# Patient Record
Sex: Male | Born: 1985 | Race: White | Hispanic: No | Marital: Married | Smoking: Never smoker
Health system: Southern US, Community
[De-identification: ages and names within clinical notes are randomized; demographics above are authoritative.]

## PROBLEM LIST (undated history)

## (undated) DIAGNOSIS — K5790 Diverticulosis of intestine, part unspecified, without perforation or abscess without bleeding: Secondary | ICD-10-CM

## (undated) DIAGNOSIS — U071 COVID-19: Secondary | ICD-10-CM

## (undated) DIAGNOSIS — K5792 Diverticulitis of intestine, part unspecified, without perforation or abscess without bleeding: Secondary | ICD-10-CM

## (undated) HISTORY — PX: HERNIA REPAIR: SHX51

## (undated) HISTORY — DX: COVID-19: U07.1

---
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
## 2005-11-16 ENCOUNTER — Emergency Department (HOSPITAL_COMMUNITY): Admission: EM | Admit: 2005-11-16 | Discharge: 2005-11-16 | Payer: Self-pay | Admitting: *Deleted

## 2006-09-14 ENCOUNTER — Emergency Department (HOSPITAL_COMMUNITY): Admission: EM | Admit: 2006-09-14 | Discharge: 2006-09-14 | Payer: Self-pay | Admitting: Emergency Medicine

## 2014-02-02 ENCOUNTER — Ambulatory Visit (INDEPENDENT_AMBULATORY_CARE_PROVIDER_SITE_OTHER): Payer: Self-pay | Admitting: Physician Assistant

## 2014-02-02 VITALS — BP 118/76 | HR 75 | Temp 97.8°F | Resp 18 | Ht 66.5 in | Wt 184.0 lb

## 2014-02-02 DIAGNOSIS — L723 Sebaceous cyst: Secondary | ICD-10-CM

## 2014-02-02 DIAGNOSIS — L720 Epidermal cyst: Secondary | ICD-10-CM

## 2014-02-02 NOTE — Patient Instructions (Signed)
[facility] [address], [place], [place] [address] [phone]

## 2014-02-02 NOTE — Progress Notes (Signed)
   Subjective:    Patient ID: Paul Rodgers, male    DOB: Jul 10, 1985, 28 y.o.   MRN: [id number]  HPI Pt presents to clinic with a lesion on his penis that has been there for several months - it had not gotten larger until about 2 days ago when it got slightly larger but no erythema and no pain.  He had STD testing at another clinic about 1 month ago and has not heard from them or the health department.  He was told he had micro-wart. He has a new partner.  Review of Systems  Genitourinary: Negative for discharge and penile pain.       Objective:   Physical Exam  Vitals reviewed. Constitutional: He is oriented to person, place, and time. He appears well-developed and well-nourished.  HENT:  Head: Normocephalic and atraumatic.  Right Ear: External ear normal.  Left Ear: External ear normal.  Pulmonary/Chest: Effort normal.  Genitourinary: Circumcised.  Inclusion cyst removed with pressure - no erythema around the cyst.  No other lesions seen.  Neurological: He is alert and oriented to person, place, and time.  Skin: Skin is warm and dry.  Psychiatric: He has a normal mood and affect. His behavior is normal. Judgment and thought content normal.      Assessment & Plan:  Inclusion cyst - removed.  Suggested he go to [facility] for free STD testing.  Benny Lennert PA-C  [facility] 02/02/2014 7:51 PM

## 2014-02-17 ENCOUNTER — Ambulatory Visit (INDEPENDENT_AMBULATORY_CARE_PROVIDER_SITE_OTHER): Payer: Self-pay | Admitting: Family Medicine

## 2014-02-17 VITALS — BP 116/68 | HR 97 | Temp 98.5°F | Resp 18 | Ht 65.5 in | Wt 180.2 lb

## 2014-02-17 DIAGNOSIS — R197 Diarrhea, unspecified: Secondary | ICD-10-CM

## 2014-02-17 DIAGNOSIS — R1030 Lower abdominal pain, unspecified: Secondary | ICD-10-CM

## 2014-02-17 DIAGNOSIS — R110 Nausea: Secondary | ICD-10-CM

## 2014-02-17 DIAGNOSIS — R109 Unspecified abdominal pain: Secondary | ICD-10-CM

## 2014-02-17 LAB — POCT UA - MICROSCOPIC ONLY
Bacteria, U Microscopic: NEGATIVE
Casts, Ur, LPF, POC: NEGATIVE
Crystals, Ur, HPF, POC: NEGATIVE
Mucus, UA: NEGATIVE
Yeast, UA: NEGATIVE

## 2014-02-17 LAB — POCT URINALYSIS DIPSTICK
Bilirubin, UA: NEGATIVE
Blood, UA: NEGATIVE
Glucose, UA: NEGATIVE
Ketones, UA: NEGATIVE
Leukocytes, UA: NEGATIVE
Nitrite, UA: NEGATIVE
Spec Grav, UA: 1.015
Urobilinogen, UA: 0.2
pH, UA: 7

## 2014-02-17 LAB — POCT CBC
Granulocyte percent: 85.3 %G — AB (ref 37–80)
HCT, POC: 45.8 % (ref 43.5–53.7)
Hemoglobin: 15.5 g/dL (ref 14.1–18.1)
Lymph, poc: 1.3 (ref 0.6–3.4)
MCH, POC: 30 pg (ref 27–31.2)
MCHC: 33.9 g/dL (ref 31.8–35.4)
MCV: 88.4 fL (ref 80–97)
MID (cbc): 0.4 (ref 0–0.9)
MPV: 7.1 fL (ref 0–99.8)
POC Granulocyte: 9.9 — AB (ref 2–6.9)
POC LYMPH PERCENT: 11.4 %L (ref 10–50)
POC MID %: 3.3 %M (ref 0–12)
Platelet Count, POC: 200 10*3/uL (ref 142–424)
RBC: 5.18 M/uL (ref 4.69–6.13)
RDW, POC: 12.7 %
WBC: 11.6 10*3/uL — AB (ref 4.6–10.2)

## 2014-02-17 MED ORDER — ONDANSETRON 4 MG PO TBDP: 4.0000 mg | ORAL_TABLET | Freq: Three times a day (TID) | ORAL | Status: DC | PRN

## 2014-02-17 MED ORDER — DICYCLOMINE HCL 10 MG PO CAPS
ORAL_CAPSULE | ORAL | Status: DC
Start: 2014-02-17 — End: 2014-02-18

## 2014-02-17 NOTE — Progress Notes (Signed)
Subjective: 28 year old male who and diarrhea developed and yesterday. He had some broth. He really didn't have anything to eat from yesterday to this morning. He tried eating something then mid morning. After couple of bites he quit because he didn't have an appetite and felt like he would come back up. He vomited several times. He felt warm to touch yesterday, but has not had any documented fever and was afebrile when he came in here. He is not any diarrhea this afternoon. Today started hurting across lower abdomen. No surgeries on his abdomen  Objective: Chest clear. Heart regular without murmurs. Abdomen has active bowel 7. It is a little firm. He is tender all the way across the lower abdomen. Not more left or right. Actually it seems to be most tender suprapubic in the midline where he is guarding some.    Results for orders placed in visit on 02/17/14  POCT CBC      Result Value Ref Range   WBC 11.6 (*) 4.6 - 10.2 K/uL   Lymph, poc 1.3  0.6 - 3.4   POC LYMPH PERCENT 11.4  10 - 50 %L   MID (cbc) 0.4  0 - 0.9   POC MID % 3.3  0 - 12 %M   POC Granulocyte 9.9 (*) 2 - 6.9   Granulocyte percent 85.3 (*) 37 - 80 %G   RBC 5.18  4.69 - 6.13 M/uL   Hemoglobin 15.5  14.1 - 18.1 g/dL   HCT, POC 16.1  09.6 - 53.7 %   MCV 88.4  80 - 97 fL   MCH, POC 30.0  27 - 31.2 pg   MCHC 33.9  31.8 - 35.4 g/dL   RDW, POC 04.5     Platelet Count, POC 200  142 - 424 K/uL   MPV 7.1  0 - 99.8 fL  POCT UA - MICROSCOPIC ONLY      Result Value Ref Range   WBC, Ur, HPF, POC 1-2     RBC, urine, microscopic 0-1     Bacteria, U Microscopic neg     Mucus, UA neg     Epithelial cells, urine per micros 0-1     Crystals, Ur, HPF, POC neg     Casts, Ur, LPF, POC neg     Yeast, UA neg     Renal tubular cells      POCT URINALYSIS DIPSTICK      Result Value Ref Range   Color, UA amber     Clarity, UA clear     Glucose, UA neg     Bilirubin, UA neg     Ketones, UA neg     Spec Grav, UA 1.015     Blood, UA neg      pH, UA 7.0     Protein, UA trace     Urobilinogen, UA 0.2     Nitrite, UA neg     Leukocytes, UA Negative     Assessment: Little abdominal pain Nausea and vomiting Diarrhea  Plan: I think this is an intestinal virus. He does have some mild leukocytosis. Cautioned him that we do not want to miss a acute abdomen and in the event of any worsening he is to go to the emergency room. It is not improved some by midday tomorrow he is to come in and get rechecked and a repeat CBC.

## 2014-02-17 NOTE — Patient Instructions (Signed)
Drink plenty of fluids  Avoid any heavy eating.  Take the Bentyl (dicyclomine) 1 pill 4 times daily if needed for abdominal pain and cramping  Use the Zofran (ondansetron) one pill dissolved under tongue every 6 hours as needed for nausea or vomiting  If worse at any time go to the emergency room or return here for a recheck. Things to watch out for cautiously include:  Fever  Increased vomiting  Increased pain  Bloody stools  Generalized worsening  If not any better by noon tomorrow, return here to get a recheck and repeat blood count done

## 2014-02-18 ENCOUNTER — Encounter (HOSPITAL_COMMUNITY): Payer: Self-pay

## 2014-02-18 ENCOUNTER — Ambulatory Visit (INDEPENDENT_AMBULATORY_CARE_PROVIDER_SITE_OTHER): Payer: Self-pay | Admitting: Family Medicine

## 2014-02-18 ENCOUNTER — Inpatient Hospital Stay (HOSPITAL_COMMUNITY)
Admission: EM | Admit: 2014-02-18 | Discharge: 2014-02-22 | DRG: 392 | Disposition: A | Discharge status: Home / Self Care | Payer: Self-pay | Attending: General Surgery | Admitting: General Surgery

## 2014-02-18 ENCOUNTER — Ambulatory Visit (HOSPITAL_COMMUNITY)
Admission: RE | Admit: 2014-02-18 | Discharge: 2014-02-18 | Disposition: A | Discharge status: Home / Self Care | Payer: Self-pay | Source: Ambulatory Visit | Attending: Family Medicine | Admitting: Family Medicine

## 2014-02-18 ENCOUNTER — Encounter (HOSPITAL_COMMUNITY): Payer: Self-pay | Admitting: Emergency Medicine

## 2014-02-18 VITALS — BP 118/72 | HR 117 | Temp 102.8°F | Resp 18 | Ht 65.5 in | Wt 180.0 lb

## 2014-02-18 DIAGNOSIS — R1031 Right lower quadrant pain: Secondary | ICD-10-CM

## 2014-02-18 DIAGNOSIS — K5720 Diverticulitis of large intestine with perforation and abscess without bleeding: Principal | ICD-10-CM | POA: Diagnosis present

## 2014-02-18 DIAGNOSIS — R509 Fever, unspecified: Secondary | ICD-10-CM

## 2014-02-18 DIAGNOSIS — R110 Nausea: Secondary | ICD-10-CM | POA: Insufficient documentation

## 2014-02-18 DIAGNOSIS — Z23 Encounter for immunization: Secondary | ICD-10-CM

## 2014-02-18 DIAGNOSIS — K5732 Diverticulitis of large intestine without perforation or abscess without bleeding: Secondary | ICD-10-CM | POA: Diagnosis present

## 2014-02-18 LAB — CBC WITH DIFFERENTIAL/PLATELET
Basophils Absolute: 0 10*3/uL (ref 0.0–0.1)
Basophils Relative: 0 % (ref 0–1)
Eosinophils Absolute: 0.1 10*3/uL (ref 0.0–0.7)
Eosinophils Relative: 1 % (ref 0–5)
HEMATOCRIT: 40.8 % (ref 39.0–52.0)
HEMOGLOBIN: 14.2 g/dL (ref 13.0–17.0)
Lymphocytes Relative: 10 % — ABNORMAL LOW (ref 12–46)
Lymphs Abs: 0.8 10*3/uL (ref 0.7–4.0)
MCH: 29.2 pg (ref 26.0–34.0)
MCHC: 34.8 g/dL (ref 30.0–36.0)
MCV: 84 fL (ref 78.0–100.0)
MONO ABS: 0.5 10*3/uL (ref 0.1–1.0)
MONOS PCT: 7 % (ref 3–12)
NEUTROS ABS: 6.3 10*3/uL (ref 1.7–7.7)
Neutrophils Relative %: 82 % — ABNORMAL HIGH (ref 43–77)
Platelets: 162 10*3/uL (ref 150–400)
RBC: 4.86 MIL/uL (ref 4.22–5.81)
RDW: 12 % (ref 11.5–15.5)
WBC: 7.7 10*3/uL (ref 4.0–10.5)

## 2014-02-18 LAB — COMPREHENSIVE METABOLIC PANEL
ALK PHOS: 138 U/L — AB (ref 39–117)
ALT: 63 U/L — ABNORMAL HIGH (ref 0–53)
AST: 30 U/L (ref 0–37)
Albumin: 3.7 g/dL (ref 3.5–5.2)
Anion gap: 17 — ABNORMAL HIGH (ref 5–15)
BILIRUBIN TOTAL: 1.5 mg/dL — AB (ref 0.3–1.2)
BUN: 13 mg/dL (ref 6–23)
CHLORIDE: 95 meq/L — AB (ref 96–112)
CO2: 22 meq/L (ref 19–32)
CREATININE: 1.09 mg/dL (ref 0.50–1.35)
Calcium: 9.1 mg/dL (ref 8.4–10.5)
GFR calc Af Amer: >90 mL/min (ref >90)
GFR, EST NON AFRICAN AMERICAN: 90 mL/min — AB (ref >90)
Glucose, Bld: 103 mg/dL — ABNORMAL HIGH (ref 70–99)
POTASSIUM: 3.8 meq/L (ref 3.7–5.3)
Sodium: 134 mEq/L — ABNORMAL LOW (ref 137–147)
Total Protein: 7.6 g/dL (ref 6.0–8.3)

## 2014-02-18 LAB — POCT CBC
Granulocyte percent: 88.5 %G — AB (ref 37–80)
HCT, POC: 45 % (ref 43.5–53.7)
Hemoglobin: 15.2 g/dL (ref 14.1–18.1)
Lymph, poc: 0.7 (ref 0.6–3.4)
MCH, POC: 30.2 pg (ref 27–31.2)
MCHC: 33.8 g/dL (ref 31.8–35.4)
MCV: 89.5 fL (ref 80–97)
MID (cbc): 0.4 (ref 0–0.9)
MPV: 7.2 fL (ref 0–99.8)
POC Granulocyte: 8.2 — AB (ref 2–6.9)
POC LYMPH PERCENT: 7.6 %L — AB (ref 10–50)
POC MID %: 3.9 %M (ref 0–12)
Platelet Count, POC: 176 10*3/uL (ref 142–424)
RBC: 5.03 M/uL (ref 4.69–6.13)
RDW, POC: 12.5 %
WBC: 9.3 10*3/uL (ref 4.6–10.2)

## 2014-02-18 LAB — I-STAT CG4 LACTIC ACID, ED: LACTIC ACID, VENOUS: 1.05 mmol/L (ref 0.5–2.2)

## 2014-02-18 MED ORDER — ONDANSETRON 4 MG PO TBDP
4.0000 mg | ORAL_TABLET | Freq: Once | ORAL | Status: AC
  Administered 2014-02-18: 4 mg via ORAL

## 2014-02-18 MED ORDER — ENOXAPARIN SODIUM 40 MG/0.4ML ~~LOC~~ SOLN
40.0000 mg | SUBCUTANEOUS | Status: DC
  Administered 2014-02-18 – 2014-02-21 (×4): 40 mg via SUBCUTANEOUS
  Filled 2014-02-18 (×5): qty 0.4

## 2014-02-18 MED ORDER — DEXTROSE 5 % IV SOLN
2.0000 g | INTRAVENOUS | Status: DC
  Administered 2014-02-18 – 2014-02-21 (×4): 2 g via INTRAVENOUS
  Filled 2014-02-18 (×6): qty 2

## 2014-02-18 MED ORDER — ONDANSETRON HCL 4 MG/2ML IJ SOLN: 4.0000 mg | Freq: Four times a day (QID) | INTRAMUSCULAR | Status: DC | PRN

## 2014-02-18 MED ORDER — KCL-LACTATED RINGERS-D5W 20 MEQ/L IV SOLN
INTRAVENOUS | Status: DC
  Administered 2014-02-18: 100 mL/h via INTRAVENOUS
  Administered 2014-02-19 – 2014-02-20 (×3): via INTRAVENOUS
  Filled 2014-02-18 (×7): qty 1000

## 2014-02-18 MED ORDER — METRONIDAZOLE IN NACL 5-0.79 MG/ML-% IV SOLN
500.0000 mg | Freq: Three times a day (TID) | INTRAVENOUS | Status: DC
  Administered 2014-02-18 – 2014-02-22 (×11): 500 mg via INTRAVENOUS
  Filled 2014-02-18 (×12): qty 100

## 2014-02-18 MED ORDER — IOHEXOL 300 MG/ML  SOLN
100.0000 mL | Freq: Once | INTRAMUSCULAR | Status: AC | PRN
  Administered 2014-02-18: 100 mL via INTRAVENOUS

## 2014-02-18 MED ORDER — PANTOPRAZOLE SODIUM 40 MG IV SOLR
40.0000 mg | Freq: Every day | INTRAVENOUS | Status: DC
  Administered 2014-02-18 – 2014-02-21 (×4): 40 mg via INTRAVENOUS
  Filled 2014-02-18 (×6): qty 40

## 2014-02-18 MED ORDER — CIPROFLOXACIN IN D5W 400 MG/200ML IV SOLN
400.0000 mg | Freq: Once | INTRAVENOUS | Status: DC
  Administered 2014-02-18: 400 mg via INTRAVENOUS
  Filled 2014-02-18: qty 200

## 2014-02-18 MED ORDER — METRONIDAZOLE IN NACL 5-0.79 MG/ML-% IV SOLN
500.0000 mg | Freq: Once | INTRAVENOUS | Status: DC
  Filled 2014-02-18: qty 100

## 2014-02-18 MED ORDER — HYDROMORPHONE HCL 1 MG/ML IJ SOLN
1.0000 mg | Freq: Once | INTRAMUSCULAR | Status: DC
  Administered 2014-02-18: 1 mg via INTRAVENOUS
  Filled 2014-02-18: qty 1

## 2014-02-18 MED ORDER — MORPHINE SULFATE 2 MG/ML IJ SOLN
2.0000 mg | INTRAMUSCULAR | Status: DC | PRN
  Administered 2014-02-19: 2 mg via INTRAVENOUS
  Filled 2014-02-18: qty 1

## 2014-02-18 MED ORDER — IOHEXOL 300 MG/ML  SOLN
50.0000 mL | Freq: Once | INTRAMUSCULAR | Status: AC | PRN
  Administered 2014-02-18: 50 mL via ORAL

## 2014-02-18 NOTE — Patient Instructions (Signed)
Go to [facility] register at the Emergency Department. Register as OUT PATIENT CT. DO NOT register as ED patient.

## 2014-02-18 NOTE — H&P (Signed)
Paul Rodgers is an 28 y.o. male.   Chief Complaint: abdominal pain with fever HPI: this is a 28 year old male who was in his normal state of health until 2 days ago when he had some nausea and diarrhea. Yesterday he began having lower abdominal pain.  He presented to urgent care and was noted to have a mild leukocytosis. He was told to come back if he had fever with the pain. The pain worsened today and he had a fever. He subsequently was sent for a CT scan in the hospital and this demonstrated findings consistent with acute diverticulitis with a microperforation and a small amount of air around the inflamed segment of sigmoid colon. I was asked to see him at this time. No previous history of diverticulitis.  History reviewed. No pertinent past medical history.  Past Surgical History  Procedure Laterality Date  . Hernia repair      History reviewed. No pertinent family history. Social History:  reports that he has never smoked. He has never used smokeless tobacco. He reports that he drinks alcohol. He reports that he does not use illicit drugs.  Allergies: No Known Allergies  Prior to Admission medications   Medication Sig Start Date End Date Taking? Authorizing Provider  acetaminophen (TYLENOL) 500 MG tablet Take 500 mg by mouth every 6 (six) hours as needed.   Yes Historical Provider, MD  dicyclomine (BENTYL) 10 MG capsule Take 10 mg by mouth 4 (four) times daily as needed for spasms. Take one pill 4 times daily if needed for abdominal pain and cramping and diarrhea 02/17/14  Yes Peyton Najjar, MD  Multiple Vitamin (MULTIVITAMIN WITH MINERALS) TABS tablet Take 1 tablet by mouth daily.   Yes Historical Provider, MD  ondansetron (ZOFRAN ODT) 4 MG disintegrating tablet Take 1 tablet (4 mg total) by mouth every 8 (eight) hours as needed for nausea or vomiting. 02/17/14   Peyton Najjar, MD     (Not in a hospital admission)  Results for orders placed during the hospital encounter of  02/18/14 (from the past 48 hour(s))  CBC WITH DIFFERENTIAL     Status: Abnormal   Collection Time    02/18/14  8:01 PM      Result Value Ref Range   WBC 7.7  4.0 - 10.5 K/uL   RBC 4.86  4.22 - 5.81 MIL/uL   Hemoglobin 14.2  13.0 - 17.0 g/dL   HCT 16.1  09.6 - 04.5 %   MCV 84.0  78.0 - 100.0 fL   MCH 29.2  26.0 - 34.0 pg   MCHC 34.8  30.0 - 36.0 g/dL   RDW 40.9  81.1 - 91.4 %   Platelets 162  150 - 400 K/uL   Neutrophils Relative % 82 (*) 43 - 77 %   Neutro Abs 6.3  1.7 - 7.7 K/uL   Lymphocytes Relative 10 (*) 12 - 46 %   Lymphs Abs 0.8  0.7 - 4.0 K/uL   Monocytes Relative 7  3 - 12 %   Monocytes Absolute 0.5  0.1 - 1.0 K/uL   Eosinophils Relative 1  0 - 5 %   Eosinophils Absolute 0.1  0.0 - 0.7 K/uL   Basophils Relative 0  0 - 1 %   Basophils Absolute 0.0  0.0 - 0.1 K/uL  I-STAT CG4 LACTIC ACID, ED     Status: None   Collection Time    02/18/14  8:13 PM      Result Value  Ref Range   Lactic Acid, Venous 1.05  0.5 - 2.2 mmol/L   Ct Abdomen Pelvis W Contrast  02/18/2014   CLINICAL DATA:  Right lower quadrant abdominal pain.  EXAM: CT ABDOMEN AND PELVIS WITH CONTRAST  TECHNIQUE: Multidetector CT imaging of the abdomen and pelvis was performed using the standard protocol following bolus administration of intravenous contrast.  CONTRAST:  50mL OMNIPAQUE IOHEXOL 300 MG/ML SOLN, OMNIPAQUE IOHEXOL 300 MG/ML SOLN  COMPARISON:  None.  FINDINGS: Visualized lung bases appear normal. No significant osseous abnormality is noted.  No gallstones are noted. Fatty infiltration of the liver is noted. The spleen and pancreas appear normal. Adrenal glands and kidneys appear normal. No hydronephrosis or renal obstruction is noted. Moderate urinary bladder distention is noted. The appendix appears normal. Focal diverticulitis of the proximal sigmoid colon is noted with moderate amount of extraluminal gas seen posteriorly and superiorly consistent with small perforation no definite abnormal fluid  collection or abscess is seen at this time. No significant adenopathy is noted.  IMPRESSION: Fatty infiltration of the liver.  Moderate distention of the urinary bladder.  Normal appendix.  Focal diverticulitis is seen involving the proximal sigmoid colon with moderate amount of gas adjacent to the inflamed bowel consistent with perforation. No definite abscess or defined fluid collection is seen at this time. Critical Value/emergent results were called by telephone at the time of interpretation on 02/18/2014 at 7:17 pm to Dr. Norberto Sorenson , who verbally acknowledged these results.   Electronically Signed   By: Roque Lias M.D.   On: 02/18/2014 19:18    Review of Systems  Constitutional: Positive for fever.  Respiratory: Negative.   Cardiovascular: Negative.   Gastrointestinal: Positive for nausea, vomiting, abdominal pain and diarrhea.  Genitourinary: Negative for dysuria and hematuria.  Neurological: Negative for seizures.  Endo/Heme/Allergies: Negative.     Blood pressure 116/79, pulse 95, temperature 98.9 F (37.2 C), temperature source Oral, height 5' 0.5" (1.537 m), weight 180 lb (81.647 kg), SpO2 98.00%. Physical Exam  Constitutional: He is oriented to person, place, and time. He appears well-developed and well-nourished. No distress.  HENT:  Head: Normocephalic and atraumatic.  Eyes: No scleral icterus.  Neck: Neck supple.  Cardiovascular: Normal rate and regular rhythm.   Respiratory: Effort normal and breath sounds normal.  GI: Soft. He exhibits no mass. There is tenderness. There is guarding (left lower quadrant).  Musculoskeletal: He exhibits no edema.  Lymphadenopathy:    He has no cervical adenopathy.  Neurological: He is alert and oriented to person, place, and time.  Skin: Skin is warm and dry.  Psychiatric: He has a normal mood and affect. His behavior is normal.     Assessment/Plan  Acute sigmoid diverticulitis. Small amount of air around an inflamed segment of  colon.  Plan: Admit to the hospital.  Start broad-spectrum IV antibiotics. Bowel rest. Recheck CBC and exam in the morning.  Beckham Buxbaum J 02/18/2014, 9:32 PM

## 2014-02-18 NOTE — ED Provider Notes (Signed)
CSN: [id number]     Arrival date & time 02/18/14  1923 History   First MD Initiated Contact with Patient 02/18/14 1926     Chief Complaint  Patient presents with  . Abdominal Pain   (Consider location/radiation/quality/duration/timing/severity/associated sxs/prior Treatment) HPI Paul Rodgers is a 28 yo male presenting from Urgent care after CT scan today. Pt states his symptoms began 2 days ago with some diarrhea followed by some vomiting.  The next day he began having some abdominal pain and was seen at an urgent care and received meds for cramping and nausea and told to return if not improved.  This am he had a fever and worsening abd pain and fever and went back to the urgent care.  They sent him for an outpt CT scan which shows diverticulitis with perforation.   History reviewed. No pertinent past medical history. Past Surgical History  Procedure Laterality Date  . Hernia repair     History reviewed. No pertinent family history. History  Substance Use Topics  . Smoking status: Never Smoker   . Smokeless tobacco: Never Used  . Alcohol Use: Yes    Review of Systems  Constitutional: Positive for fever and chills.  HENT: Negative for sore throat.   Eyes: Negative for visual disturbance.  Respiratory: Negative for cough and shortness of breath.   Cardiovascular: Negative for chest pain and leg swelling.  Gastrointestinal: Positive for nausea, vomiting, abdominal pain and diarrhea.  Genitourinary: Negative for dysuria.  Musculoskeletal: Negative for myalgias.  Skin: Negative for rash.  Neurological: Negative for weakness, numbness and headaches.    Allergies  Review of patient's allergies indicates no known allergies.  Home Medications   Prior to Admission medications   Medication Sig Start Date End Date Taking? Authorizing Provider  acetaminophen (TYLENOL) 500 MG tablet Take 500 mg by mouth every 6 (six) hours as needed.   Yes Historical Provider, MD  dicyclomine  (BENTYL) 10 MG capsule Take 10 mg by mouth 4 (four) times daily as needed for spasms. Take one pill 4 times daily if needed for abdominal pain and cramping and diarrhea 02/17/14  Yes Peyton Najjar, MD  Multiple Vitamin (MULTIVITAMIN WITH MINERALS) TABS tablet Take 1 tablet by mouth daily.   Yes Historical Provider, MD  ondansetron (ZOFRAN ODT) 4 MG disintegrating tablet Take 1 tablet (4 mg total) by mouth every 8 (eight) hours as needed for nausea or vomiting. 02/17/14   Peyton Najjar, MD   BP 116/79  Pulse 95  Temp(Src) 98.9 F (37.2 C) (Oral)  Ht 5' 0.5" (1.537 m)  Wt 180 lb (81.647 kg)  BMI 34.56 kg/m2  SpO2 98% Physical Exam  Nursing note and vitals reviewed. Constitutional: He is oriented to person, place, and time. He appears well-developed and well-nourished. No distress.  HENT:  Head: Normocephalic and atraumatic.  Mouth/Throat: Oropharynx is clear and moist. No oropharyngeal exudate.  Eyes: Conjunctivae are normal.  Neck: Neck supple. No thyromegaly present.  Cardiovascular: Normal rate, regular rhythm and intact distal pulses.   Pulmonary/Chest: Effort normal and breath sounds normal. No respiratory distress. He has no wheezes. He has no rales. He exhibits no tenderness.  Abdominal: Soft. Normal appearance. He exhibits no distension and no mass. There is no hepatosplenomegaly. There is tenderness in the left lower quadrant. There is no rigidity, no rebound, no guarding, no CVA tenderness, no tenderness at McBurney's point and negative Murphy's sign.    Musculoskeletal: He exhibits no tenderness.  Lymphadenopathy:  He has no cervical adenopathy.  Neurological: He is alert and oriented to person, place, and time.  Skin: Skin is warm and dry. No rash noted. He is not diaphoretic.  Psychiatric: He has a normal mood and affect.    ED Course  Procedures (including critical care time) Labs Review Labs Reviewed  CBC WITH DIFFERENTIAL - Abnormal; Notable for the following:     Neutrophils Relative % 82 (*)    Lymphocytes Relative 10 (*)    All other components within normal limits  COMPREHENSIVE METABOLIC PANEL - Abnormal; Notable for the following:    Sodium 134 (*)    Chloride 95 (*)    Glucose, Bld 103 (*)    ALT 63 (*)    Alkaline Phosphatase 138 (*)    Total Bilirubin 1.5 (*)    GFR calc non Af Amer 90 (*)    Anion gap 17 (*)    All other components within normal limits  CBC  I-STAT CG4 LACTIC ACID, ED    Imaging Review Ct Abdomen Pelvis W Contrast  02/18/2014   CLINICAL DATA:  Right lower quadrant abdominal pain.  EXAM: CT ABDOMEN AND PELVIS WITH CONTRAST  TECHNIQUE: Multidetector CT imaging of the abdomen and pelvis was performed using the standard protocol following bolus administration of intravenous contrast.  CONTRAST:  50mL OMNIPAQUE IOHEXOL 300 MG/ML SOLN, OMNIPAQUE IOHEXOL 300 MG/ML SOLN  COMPARISON:  None.  FINDINGS: Visualized lung bases appear normal. No significant osseous abnormality is noted.  No gallstones are noted. Fatty infiltration of the liver is noted. The spleen and pancreas appear normal. Adrenal glands and kidneys appear normal. No hydronephrosis or renal obstruction is noted. Moderate urinary bladder distention is noted. The appendix appears normal. Focal diverticulitis of the proximal sigmoid colon is noted with moderate amount of extraluminal gas seen posteriorly and superiorly consistent with small perforation no definite abnormal fluid collection or abscess is seen at this time. No significant adenopathy is noted.  IMPRESSION: Fatty infiltration of the liver.  Moderate distention of the urinary bladder.  Normal appendix.  Focal diverticulitis is seen involving the proximal sigmoid colon with moderate amount of gas adjacent to the inflamed bowel consistent with perforation. No definite abscess or defined fluid collection is seen at this time. Critical Value/emergent results were called by telephone at the time of interpretation on  02/18/2014 at 7:17 pm to Dr. Norberto Sorenson , who verbally acknowledged these results.   Electronically Signed   By: Roque Lias M.D.   On: 02/18/2014 19:18     EKG Interpretation None      MDM   Final diagnoses:  Diverticulitis of large intestine with perforation without bleeding   28 yo male presents with abd and fever, seen at outside facility. Diverticulitis and perforation on CT scan.   CBC, CMP, Lipase, Lactic acid without significant abnormality. Pt well appearing, no acute distress  PIV, dilaudid, IV cipro and flagyl  Admitted to Surgery  Case discussed with Dr. Maia Petties Vitals:   02/18/14 2115 02/18/14 2130 02/18/14 2145 02/18/14 2153  BP: 120/76 116/75 113/68 115/70  Pulse: 92 93 91 92  Temp:      TempSrc:      Resp:    18  Height:      Weight:      SpO2: 97% 98% 98% 96%   Meds given in ED:  Medications  enoxaparin (LOVENOX) injection 40 mg (not administered)  dextrose 5% in lactated ringers with KCl 20 mEq/L  infusion (not administered)  morphine 2 MG/ML injection 2-6 mg (not administered)  metroNIDAZOLE (FLAGYL) IVPB 500 mg (not administered)  cefTRIAXone (ROCEPHIN) 2 g in dextrose 5 % 50 mL IVPB (not administered)  ondansetron (ZOFRAN) injection 4 mg (not administered)  pantoprazole (PROTONIX) injection 40 mg (not administered)    New Prescriptions   No medications on file      Harle Battiest, NP 02/21/14 1338

## 2014-02-18 NOTE — ED Notes (Signed)
Patient has had abdominal pain since Friday February 16, 2014. Patient went to dr yesterday and had blood work done. Patient had tylenol around 4pm for a fever of 102.8 at the doctor office today.

## 2014-02-18 NOTE — ED Provider Notes (Signed)
Complains of low abdominal pain onset 2 days ago accompanied by vomiting and diarrhea. Vomited last time yesterday. Maximum temperature was 102.8 today. He has been evaluated twice at [facility] urgent center, last time today. He was sent for CT scan abdomen today as outpatient as his pain became worse today. Presently patient appears comfortable on exam abdomen nondistended tender at the lower quadrants bilaterally. Spoke with Dr.Rosenbower who will evaluated patient for admission in the ED Results for orders placed during the hospital encounter of 02/18/14  CBC WITH DIFFERENTIAL      Result Value Ref Range   WBC 7.7  4.0 - 10.5 K/uL   RBC 4.86  4.22 - 5.81 MIL/uL   Hemoglobin 14.2  13.0 - 17.0 g/dL   HCT 16.1  09.6 - 04.5 %   MCV 84.0  78.0 - 100.0 fL   MCH 29.2  26.0 - 34.0 pg   MCHC 34.8  30.0 - 36.0 g/dL   RDW 40.9  81.1 - 91.4 %   Platelets 162  150 - 400 K/uL   Neutrophils Relative % 82 (*) 43 - 77 %   Neutro Abs 6.3  1.7 - 7.7 K/uL   Lymphocytes Relative 10 (*) 12 - 46 %   Lymphs Abs 0.8  0.7 - 4.0 K/uL   Monocytes Relative 7  3 - 12 %   Monocytes Absolute 0.5  0.1 - 1.0 K/uL   Eosinophils Relative 1  0 - 5 %   Eosinophils Absolute 0.1  0.0 - 0.7 K/uL   Basophils Relative 0  0 - 1 %   Basophils Absolute 0.0  0.0 - 0.1 K/uL  I-STAT CG4 LACTIC ACID, ED      Result Value Ref Range   Lactic Acid, Venous 1.05  0.5 - 2.2 mmol/L   Ct Abdomen Pelvis W Contrast  02/18/2014   CLINICAL DATA:  Right lower quadrant abdominal pain.  EXAM: CT ABDOMEN AND PELVIS WITH CONTRAST  TECHNIQUE: Multidetector CT imaging of the abdomen and pelvis was performed using the standard protocol following bolus administration of intravenous contrast.  CONTRAST:  50mL OMNIPAQUE IOHEXOL 300 MG/ML SOLN, OMNIPAQUE IOHEXOL 300 MG/ML SOLN  COMPARISON:  None.  FINDINGS: Visualized lung bases appear normal. No significant osseous abnormality is noted.  No gallstones are noted. Fatty infiltration of the liver is noted.  The spleen and pancreas appear normal. Adrenal glands and kidneys appear normal. No hydronephrosis or renal obstruction is noted. Moderate urinary bladder distention is noted. The appendix appears normal. Focal diverticulitis of the proximal sigmoid colon is noted with moderate amount of extraluminal gas seen posteriorly and superiorly consistent with small perforation no definite abnormal fluid collection or abscess is seen at this time. No significant adenopathy is noted.  IMPRESSION: Fatty infiltration of the liver.  Moderate distention of the urinary bladder.  Normal appendix.  Focal diverticulitis is seen involving the proximal sigmoid colon with moderate amount of gas adjacent to the inflamed bowel consistent with perforation. No definite abscess or defined fluid collection is seen at this time. Critical Value/emergent results were called by telephone at the time of interpretation on 02/18/2014 at 7:17 pm to Dr. Norberto Sorenson , who verbally acknowledged these results.   Electronically Signed   By: Roque Lias M.D.   On: 02/18/2014 19:18     Doug Sou, MD 02/18/14 2303

## 2014-02-18 NOTE — Progress Notes (Signed)
Subjective:    Patient ID: Paul Rodgers, male    DOB: [birth year], 28 y.o.   MRN: [id number] Chief Complaint  Patient presents with  . Fever    was here yesterday and was told if he doesnt feel better to come back    HPI  Paul Rodgers was seen for stomach pain and vomiting. At that visit he had a mild leukocytosis with increased bowel sounds and abd tenderness - most suprapubic. Urine normal. It was suspected he had a viral GI illness so sent home with precautions. He reports he had one episode of vomiting last night. No vomiting this morning, took promethazine 12.5 last night and again this morning.  Did take the bentyl last night and again this morning which helped.  Rested well. He is actually better as far as abd pain but since he is mounted a fever at home came in.   Took acetaminophen  after presentation to clinic. Sharp shooting pain has resolved. Drank pedialyte, gatorade, and a few swallows of beef soup w/o emesis.  No diarrhea since Friday.  Voiding normally - urine was dark yesterday but improved today.  History reviewed. No pertinent past medical history. No current facility-administered medications on file prior to visit.   Current Outpatient Prescriptions on File Prior to Visit  Medication Sig Dispense Refill  . acetaminophen (TYLENOL) 500 MG tablet Take 500 mg by mouth every 6 (six) hours as needed.      . ondansetron (ZOFRAN ODT) 4 MG disintegrating tablet Take 1 tablet (4 mg total) by mouth every 8 (eight) hours as needed for nausea or vomiting.  20 tablet  0   No Known Allergies   Review of Systems  Constitutional: Positive for fever, chills, diaphoresis, activity change, appetite change and fatigue.  Respiratory: Negative for shortness of breath.   Cardiovascular: Negative for chest pain.  Gastrointestinal: Positive for nausea, vomiting, abdominal pain, diarrhea and abdominal distention. Negative for constipation, blood in stool, anal bleeding and rectal  pain.  Genitourinary: Negative for dysuria, frequency, hematuria and decreased urine volume.  Skin: Positive for pallor.  Neurological: Positive for weakness.  Hematological: Negative for adenopathy.       Objective:  BP 118/72  Pulse 117  Temp(Src) 102.8 F (39.3 C) (Oral)  Resp 18  Ht 5' 5.5" (1.664 m)  Wt 180 lb (81.647 kg)  BMI 29.49 kg/m2  SpO2 98%  Physical Exam  Constitutional: He appears well-developed and well-nourished. No distress.  HENT:  Head: Normocephalic and atraumatic.  Neck: Normal range of motion. Neck supple. No thyromegaly present.  Cardiovascular: Normal rate, regular rhythm and normal heart sounds.   Pulmonary/Chest: Effort normal and breath sounds normal.  Abdominal: Soft. Normal appearance. He exhibits no distension and no mass. Bowel sounds are increased. There is no hepatosplenomegaly. There is tenderness in the right lower quadrant, periumbilical area and left lower quadrant. There is tenderness at McBurney's point. There is no rigidity, no rebound, no guarding and no CVA tenderness. No hernia.  Tympanitic BS  Genitourinary: Rectum normal and prostate normal. Rectal exam shows no tenderness and anal tone normal. Guaiac negative stool.  Lymphadenopathy:    He has no cervical adenopathy.  Skin: He is not diaphoretic.          Results for orders placed in visit on 02/18/14  POCT CBC      Result Value Ref Range   WBC 9.3  4.6 - 10.2 K/uL   Lymph, poc 0.7  0.6 - 3.4  POC LYMPH PERCENT 7.6 (*) 10 - 50 %L   MID (cbc) 0.4  0 - 0.9   POC MID % 3.9  0 - 12 %M   POC Granulocyte 8.2 (*) 2 - 6.9   Granulocyte percent 88.5 (*) 37 - 80 %G   RBC 5.03  4.69 - 6.13 M/uL   Hemoglobin 15.2  14.1 - 18.1 g/dL   HCT, POC 16.1  09.6 - 53.7 %   MCV 89.5  80 - 97 fL   MCH, POC 30.2  27 - 31.2 pg   MCHC 33.8  31.8 - 35.4 g/dL   RDW, POC 04.5     Platelet Count, POC 176  142 - 424 K/uL   MPV 7.2  0 - 99.8 fL    Assessment & Plan:   Right lower quadrant  abdominal pain - Plan: POCT CBC, CT Abdomen Pelvis W Contrast, CANCELED: POCT SEDIMENTATION RATE - exam concerning for acute abd - to [facility] for stat abd/pelvic CT  Nausea alone - Plan: ondansetron (ZOFRAN-ODT) disintegrating tablet 4 mg, CT Abdomen Pelvis W Contrast -  SL zofran   Fever, unspecified - Plan: CT Abdomen Pelvis W Contrast  Meds ordered this encounter  Medications  . ondansetron (ZOFRAN-ODT) disintegrating tablet 4 mg    Sig:     Norberto Sorenson, MD MPH  ADDENDUM: stat abd/pelvic CT shows diverticulitis w/ perf.  Pt will be escorted to ER to check-in.

## 2014-02-19 ENCOUNTER — Encounter (HOSPITAL_COMMUNITY): Payer: Self-pay | Admitting: General Practice

## 2014-02-19 LAB — CBC
HCT: 38 % — ABNORMAL LOW (ref 39.0–52.0)
Hemoglobin: 13.1 g/dL (ref 13.0–17.0)
MCH: 29.6 pg (ref 26.0–34.0)
MCHC: 34.5 g/dL (ref 30.0–36.0)
MCV: 85.8 fL (ref 78.0–100.0)
Platelets: 145 10*3/uL — ABNORMAL LOW (ref 150–400)
RBC: 4.43 MIL/uL (ref 4.22–5.81)
RDW: 12.1 % (ref 11.5–15.5)
WBC: 6.8 10*3/uL (ref 4.0–10.5)

## 2014-02-19 MED ORDER — CHLORHEXIDINE GLUCONATE 0.12 % MT SOLN
15.0000 mL | Freq: Two times a day (BID) | OROMUCOSAL | Status: DC
  Administered 2014-02-19 – 2014-02-20 (×2): 15 mL via OROMUCOSAL
  Filled 2014-02-19 (×9): qty 15

## 2014-02-19 MED ORDER — CETYLPYRIDINIUM CHLORIDE 0.05 % MT LIQD: 7.0000 mL | Freq: Two times a day (BID) | OROMUCOSAL | Status: DC

## 2014-02-19 MED ORDER — INFLUENZA VAC SPLIT QUAD 0.5 ML IM SUSY
0.5000 mL | PREFILLED_SYRINGE | INTRAMUSCULAR | Status: AC
  Administered 2014-02-20: 0.5 mL via INTRAMUSCULAR
  Filled 2014-02-19 (×2): qty 0.5

## 2014-02-19 NOTE — Progress Notes (Signed)
Alert, nontoxic, watching TV Some b/l lower abd pain - but better  Soft, nd, not really tender, obese  Discussed diverticulitis and typical hospitalization See NP note plan  Paul Rodgers. Andrey Campanile, MD, FACS General, Bariatric, & Minimally Invasive Surgery [facility], [place]

## 2014-02-19 NOTE — Progress Notes (Signed)
Patient ID: Paul Rodgers, male   DOB: 1986-01-29, 28 y.o.   MRN: [id number]     [facility]      [address] [place], [place] [address]    Phone: [phone] FAX: [phone]     Subjective: 2/10 pain.  Passing flatus, BM last Friday.  No n/v.  WBC normal today.  Febrile on admission, none since.   Objective:  Vital signs:  Filed Vitals:   02/18/14 2153 02/19/14 0017 02/19/14 0037 02/19/14 0542  BP: 115/70  108/68 108/65  Pulse: 92  94 75  Temp:   99.5 F (37.5 C) 99.3 F (37.4 C)  TempSrc:   Oral Oral  Resp: _0 Height:  5' 5.5" (1.664 m)    Weight:  177 lb 11.2 oz (80.604 kg)    SpO2: 96%  98% 100%    Last BM Date: 02/16/14  Intake/Output   Yesterday:  09/27 0701 - 09/28 0700 In: 900 [I.V.:650; IV Piggyback:250] Out: -  This shift:    I/O last 3 completed shifts: In: 900 [I.V.:650; IV Piggyback:250] Out: -     Physical Exam: General: Pt awake/alert/oriented x4 in no acute distress Chest: cta.  No chest wall pain w good excursion CV:  Pulses intact.  Regular rhythm Abdomen: Soft.  Nondistended.  Mild ttp to lower abdomen.  No evidence of peritonitis.  No incarcerated hernias. Ext:  SCDs BLE.  No mjr edema.  No cyanosis Skin: No petechiae / purpura   Problem List:   Active Problems:   Sigmoid diverticulitis    Results:   Labs: Results for orders placed during the hospital encounter of 02/18/14 (from the past 48 hour(s))  CBC WITH DIFFERENTIAL     Status: Abnormal   Collection Time    02/18/14  8:01 PM      Result Value Ref Range   WBC 7.7  4.0 - 10.5 K/uL   RBC 4.86  4.22 - 5.81 MIL/uL   Hemoglobin 14.2  13.0 - 17.0 g/dL   HCT 40.8  39.0 - 52.0 %   MCV 84.0  78.0 - 100.0 fL   MCH 29.2  26.0 - 34.0 pg   MCHC 34.8  30.0 - 36.0 g/dL   RDW 12.0  11.5 - 15.5 %   Platelets 162  150 - 400 K/uL   Neutrophils Relative % 82 (*) 43 - 77 %   Neutro Abs 6.3  1.7 - 7.7 K/uL   Lymphocytes Relative  10 (*) 12 - 46 %   Lymphs Abs 0.8  0.7 - 4.0 K/uL   Monocytes Relative 7  3 - 12 %   Monocytes Absolute 0.5  0.1 - 1.0 K/uL   Eosinophils Relative 1  0 - 5 %   Eosinophils Absolute 0.1  0.0 - 0.7 K/uL   Basophils Relative 0  0 - 1 %   Basophils Absolute 0.0  0.0 - 0.1 K/uL  COMPREHENSIVE METABOLIC PANEL     Status: Abnormal   Collection Time    02/18/14  8:01 PM      Result Value Ref Range   Sodium 134 (*) 137 - 147 mEq/L   Potassium 3.8  3.7 - 5.3 mEq/L   Chloride 95 (*) 96 - 112 mEq/L   CO2 22  19 - 32 mEq/L   Glucose, Bld 103 (*) 70 - 99 mg/dL   BUN 13  6 - 23 mg/dL   Creatinine, Ser 1.09  0.50 - 1.35 mg/dL   Calcium 9.1  8.4 - 10.5 mg/dL   Total Protein 7.6  6.0 - 8.3 g/dL   Albumin 3.7  3.5 - 5.2 g/dL   AST 30  0 - 37 U/L   ALT 63 (*) 0 - 53 U/L   Alkaline Phosphatase 138 (*) 39 - 117 U/L   Total Bilirubin 1.5 (*) 0.3 - 1.2 mg/dL   GFR calc non Af Amer 90 (*) >90 mL/min   GFR calc Af Amer >90  >90 mL/min   Comment: (NOTE)     The eGFR has been calculated using the CKD EPI equation.     This calculation has not been validated in all clinical situations.     eGFR's persistently <90 mL/min signify possible Chronic Kidney     Disease.   Anion gap 17 (*) 5 - 15  I-STAT CG4 LACTIC ACID, ED     Status: None   Collection Time    02/18/14  8:13 PM      Result Value Ref Range   Lactic Acid, Venous 1.05  0.5 - 2.2 mmol/L  CBC     Status: Abnormal   Collection Time    02/19/14  4:35 AM      Result Value Ref Range   WBC 6.8  4.0 - 10.5 K/uL   RBC 4.43  4.22 - 5.81 MIL/uL   Hemoglobin 13.1  13.0 - 17.0 g/dL   HCT 38.0 (*) 39.0 - 52.0 %   MCV 85.8  78.0 - 100.0 fL   MCH 29.6  26.0 - 34.0 pg   MCHC 34.5  30.0 - 36.0 g/dL   RDW 12.1  11.5 - 15.5 %   Platelets 145 (*) 150 - 400 K/uL    Imaging / Studies: Ct Abdomen Pelvis W Contrast  02/18/2014   CLINICAL DATA:  Right lower quadrant abdominal pain.  EXAM: CT ABDOMEN AND PELVIS WITH CONTRAST  TECHNIQUE: Multidetector CT  imaging of the abdomen and pelvis was performed using the standard protocol following bolus administration of intravenous contrast.  CONTRAST:  16m OMNIPAQUE IOHEXOL 300 MG/ML SOLN, 1042mOMNIPAQUE IOHEXOL 300 MG/ML SOLN  COMPARISON:  None.  FINDINGS: Visualized lung bases appear normal. No significant osseous abnormality is noted.  No gallstones are noted. Fatty infiltration of the liver is noted. The spleen and pancreas appear normal. Adrenal glands and kidneys appear normal. No hydronephrosis or renal obstruction is noted. Moderate urinary bladder distention is noted. The appendix appears normal. Focal diverticulitis of the proximal sigmoid colon is noted with moderate amount of extraluminal gas seen posteriorly and superiorly consistent with small perforation no definite abnormal fluid collection or abscess is seen at this time. No significant adenopathy is noted.  IMPRESSION: Fatty infiltration of the liver.  Moderate distention of the urinary bladder.  Normal appendix.  Focal diverticulitis is seen involving the proximal sigmoid colon with moderate amount of gas adjacent to the inflamed bowel consistent with perforation. No definite abscess or defined fluid collection is seen at this time. Critical Value/emergent results were called by telephone at the time of interpretation on 02/18/2014 at 7:17 pm to Dr. EVDelman Cheadle who verbally acknowledged these results.   Electronically Signed   By: JaSabino Dick.D.   On: 02/18/2014 19:18    Medications / Allergies:  Scheduled Meds: . antiseptic oral rinse  7 mL Mouth Rinse q12n4p  . cefTRIAXone (ROCEPHIN)  IV  2 g Intravenous Q24H  . chlorhexidine  15 mL Mouth  Rinse BID  . enoxaparin (LOVENOX) injection  40 mg Subcutaneous Q24H  . [START ON 02/20/2014] Influenza vac split quadrivalent PF  0.5 mL Intramuscular Tomorrow-1000  . metronidazole  500 mg Intravenous Q8H  . pantoprazole (PROTONIX) IV  40 mg Intravenous QHS   Continuous Infusions: . dextrose 5%  lactated ringers with KCl 20 mEq/L 100 mL/hr (02/18/14 2330)   PRN Meds:.morphine injection, ondansetron  Antibiotics: Anti-infectives   Start     Dose/Rate Route Frequency Ordered Stop   02/18/14 2200  metroNIDAZOLE (FLAGYL) IVPB 500 mg     500 mg 100 mL/hr over 60 Minutes Intravenous Every 8 hours 02/18/14 2132     02/18/14 2200  cefTRIAXone (ROCEPHIN) 2 g in dextrose 5 % 50 mL IVPB     2 g 100 mL/hr over 30 Minutes Intravenous Every 24 hours 02/18/14 2132     02/18/14 2000  ciprofloxacin (CIPRO) IVPB 400 mg  Status:  Discontinued     400 mg 200 mL/hr over 60 Minutes Intravenous  Once 02/18/14 1959 02/18/14 2334   02/18/14 2000  metroNIDAZOLE (FLAGYL) IVPB 500 mg  Status:  Discontinued     500 mg 100 mL/hr over 60 Minutes Intravenous  Once 02/18/14 1959 02/18/14 2132        Assessment/Plan Acute sigmoid diverticulitis with perforation--1st episode -continue bowel rest, will consider clears tomorrow if he remains stable -continue with rocephin/flagyl -SCD/loveox -IV hydration -mobilize -pain control  Erby Pian, ANP-BC [facility] Pager [phone](7A-4:30P)   02/19/2014 10:00 AM

## 2014-02-19 NOTE — Care Management Note (Addendum)
    Page 1 of 1   02/21/2014     3:43:07 PM CARE MANAGEMENT NOTE 02/21/2014  Patient:  Paul Rodgers, Paul Rodgers   Account Number:  [id number]  Date Initiated:  02/19/2014  Documentation initiated by:  Lorenda Ishihara  Subjective/Objective Assessment:   28 yo male admitted with diverticulitis with microperf. PTA lived at home with spouse.     Action/Plan:   Home when stable   Anticipated DC Date:  02/23/2014   Anticipated DC Plan:  HOME/SELF CARE      DC Planning Services  CM consult  Medication Assistance  PCP issues      Choice offered to / List presented to:             Status of service:  Completed, signed off Medicare Important Message given?   (If response is "NO", the following Medicare IM given date fields will be blank) Date Medicare IM given:   Medicare IM given by:   Date Additional Medicare IM given:   Additional Medicare IM given by:    Discharge Disposition:  HOME/SELF CARE  Per UR Regulation:  Reviewed for med. necessity/level of care/duration of stay  If discussed at Long Length of Stay Meetings, dates discussed:    Comments:  02-21-14 Lorenda Ishihara RN CM 1500 Per PA will likely d/c in am, will likely d/c on Flagyl and Cipro. Provided patient with coupons for both meds, likely out of pocket will be less than $20 depending on dosing. Patient states that is affordable. Instructed patient to call [facility] in am prior to 9:15 to secure appt with them for hospital f/u. Patient appreciative of resources and assistance.  02-20-14 Lorenda Ishihara RN CM [phone] Spoke with patient at bedside. He has family members who utilize the [employer] to assist with healthcare needs. Discussed process with him. He uses urgent care for primary care currently. Discussed [place] and [facility], he would like to f/u with them, they also can assist him in applying for [employer]. Currently not taking any medication as OP, will likely need abx at d/c. Will follow to  assist with this as well.

## 2014-02-20 LAB — CBC
HCT: 37 % — ABNORMAL LOW (ref 39.0–52.0)
Hemoglobin: 12.7 g/dL — ABNORMAL LOW (ref 13.0–17.0)
MCH: 29.2 pg (ref 26.0–34.0)
MCHC: 34.3 g/dL (ref 30.0–36.0)
MCV: 85.1 fL (ref 78.0–100.0)
PLATELETS: 167 10*3/uL (ref 150–400)
RBC: 4.35 MIL/uL (ref 4.22–5.81)
RDW: 12.1 % (ref 11.5–15.5)
WBC: 5.2 10*3/uL (ref 4.0–10.5)

## 2014-02-20 MED ORDER — HYDROCODONE-ACETAMINOPHEN 5-325 MG PO TABS: 1.0000 | ORAL_TABLET | ORAL | Status: DC | PRN

## 2014-02-20 MED ORDER — ACETAMINOPHEN 325 MG PO TABS: 325.0000 mg | ORAL_TABLET | Freq: Four times a day (QID) | ORAL | Status: DC | PRN

## 2014-02-20 NOTE — Progress Notes (Signed)
Patient ID: Paul Rodgers, male   DOB: 09/05/1985, 28 y.o.   MRN: [id number]     [facility]      [address]   [place], [place] [address]    Phone: [phone] FAX: [phone]     Subjective: Pain resolved.  No n/v.  Passing flatus. Afebrile.  VSS. Normal white count.   Objective:  Vital signs:  Filed Vitals:   02/19/14 1400 02/19/14 1800 02/19/14 2209 02/20/14 0630  BP: 104/63 123/68 107/58 90/58  Pulse: 86 78 80 72  Temp: 98.7 F (37.1 C) 98 F (36.7 C) 98.8 F (37.1 C) 98.9 F (37.2 C)  TempSrc: Oral Oral Oral Oral  Resp: 16 16 18 18  Height:      Weight:      SpO2: 97% 100% 100% 100%    Last BM Date: 02/16/14  Intake/Output   Yesterday:  09/28 0701 - 09/29 0700 In: 2750 [I.V.:2400; IV Piggyback:350] Out: -  This shift:    I/O last 3 completed shifts: In: 3650 [I.V.:3050; IV Piggyback:600] Out: -     Physical Exam:  General: Pt awake/alert/oriented x4 in no acute distress  Chest: cta. No chest wall pain w good excursion  CV: Pulses intact. Regular rhythm  Abdomen: Soft. Nondistended. Non tender.  No evidence of peritonitis. No incarcerated hernias.  Ext: SCDs BLE. No mjr edema. No cyanosis  Skin: No petechiae / purpura   Problem List:   Active Problems:   Sigmoid diverticulitis    Results:   Labs: Results for orders placed during the hospital encounter of 02/18/14 (from the past 48 hour(s))  CBC WITH DIFFERENTIAL     Status: Abnormal   Collection Time    02/18/14  8:01 PM      Result Value Ref Range   WBC 7.7  4.0 - 10.5 K/uL   RBC 4.86  4.22 - 5.81 MIL/uL   Hemoglobin 14.2  13.0 - 17.0 g/dL   HCT 40.8  39.0 - 52.0 %   MCV 84.0  78.0 - 100.0 fL   MCH 29.2  26.0 - 34.0 pg   MCHC 34.8  30.0 - 36.0 g/dL   RDW 12.0  11.5 - 15.5 %   Platelets 162  150 - 400 K/uL   Neutrophils Relative % 82 (*) 43 - 77 %   Neutro Abs 6.3  1.7 - 7.7 K/uL   Lymphocytes Relative 10 (*) 12 - 46 %   Lymphs Abs  0.8  0.7 - 4.0 K/uL   Monocytes Relative 7  3 - 12 %   Monocytes Absolute 0.5  0.1 - 1.0 K/uL   Eosinophils Relative 1  0 - 5 %   Eosinophils Absolute 0.1  0.0 - 0.7 K/uL   Basophils Relative 0  0 - 1 %   Basophils Absolute 0.0  0.0 - 0.1 K/uL  COMPREHENSIVE METABOLIC PANEL     Status: Abnormal   Collection Time    02/18/14  8:01 PM      Result Value Ref Range   Sodium 134 (*) 137 - 147 mEq/L   Potassium 3.8  3.7 - 5.3 mEq/L   Chloride 95 (*) 96 - 112 mEq/L   CO2 22  19 - 32 mEq/L   Glucose, Bld 103 (*) 70 - 99 mg/dL   BUN 13  6 - 23 mg/dL   Creatinine, Ser 1.09  0.50 - 1.35 mg/dL   Calcium 9.1  8.4 - 10.5 mg/dL     Total Protein 7.6  6.0 - 8.3 g/dL   Albumin 3.7  3.5 - 5.2 g/dL   AST 30  0 - 37 U/L   ALT 63 (*) 0 - 53 U/L   Alkaline Phosphatase 138 (*) 39 - 117 U/L   Total Bilirubin 1.5 (*) 0.3 - 1.2 mg/dL   GFR calc non Af Amer 90 (*) >90 mL/min   GFR calc Af Amer >90  >90 mL/min   Comment: (NOTE)     The eGFR has been calculated using the CKD EPI equation.     This calculation has not been validated in all clinical situations.     eGFR's persistently <90 mL/min signify possible Chronic Kidney     Disease.   Anion gap 17 (*) 5 - 15  I-STAT CG4 LACTIC ACID, ED     Status: None   Collection Time    02/18/14  8:13 PM      Result Value Ref Range   Lactic Acid, Venous 1.05  0.5 - 2.2 mmol/L  CBC     Status: Abnormal   Collection Time    02/19/14  4:35 AM      Result Value Ref Range   WBC 6.8  4.0 - 10.5 K/uL   RBC 4.43  4.22 - 5.81 MIL/uL   Hemoglobin 13.1  13.0 - 17.0 g/dL   HCT 38.0 (*) 39.0 - 52.0 %   MCV 85.8  78.0 - 100.0 fL   MCH 29.6  26.0 - 34.0 pg   MCHC 34.5  30.0 - 36.0 g/dL   RDW 12.1  11.5 - 15.5 %   Platelets 145 (*) 150 - 400 K/uL  CBC     Status: Abnormal   Collection Time    02/20/14  4:46 AM      Result Value Ref Range   WBC 5.2  4.0 - 10.5 K/uL   RBC 4.35  4.22 - 5.81 MIL/uL   Hemoglobin 12.7 (*) 13.0 - 17.0 g/dL   HCT 37.0 (*) 39.0 - 52.0 %    MCV 85.1  78.0 - 100.0 fL   MCH 29.2  26.0 - 34.0 pg   MCHC 34.3  30.0 - 36.0 g/dL   RDW 12.1  11.5 - 15.5 %   Platelets 167  150 - 400 K/uL    Imaging / Studies: Ct Abdomen Pelvis W Contrast  02/18/2014   CLINICAL DATA:  Right lower quadrant abdominal pain.  EXAM: CT ABDOMEN AND PELVIS WITH CONTRAST  TECHNIQUE: Multidetector CT imaging of the abdomen and pelvis was performed using the standard protocol following bolus administration of intravenous contrast.  CONTRAST:  30m OMNIPAQUE IOHEXOL 300 MG/ML SOLN, 1049mOMNIPAQUE IOHEXOL 300 MG/ML SOLN  COMPARISON:  None.  FINDINGS: Visualized lung bases appear normal. No significant osseous abnormality is noted.  No gallstones are noted. Fatty infiltration of the liver is noted. The spleen and pancreas appear normal. Adrenal glands and kidneys appear normal. No hydronephrosis or renal obstruction is noted. Moderate urinary bladder distention is noted. The appendix appears normal. Focal diverticulitis of the proximal sigmoid colon is noted with moderate amount of extraluminal gas seen posteriorly and superiorly consistent with small perforation no definite abnormal fluid collection or abscess is seen at this time. No significant adenopathy is noted.  IMPRESSION: Fatty infiltration of the liver.  Moderate distention of the urinary bladder.  Normal appendix.  Focal diverticulitis is seen involving the proximal sigmoid colon with moderate amount of gas adjacent to the inflamed bowel consistent  with perforation. No definite abscess or defined fluid collection is seen at this time. Critical Value/emergent results were called by telephone at the time of interpretation on 02/18/2014 at 7:17 pm to Dr. EVA SHAW , who verbally acknowledged these results.   Electronically Signed   By: James  Green M.D.   On: 02/18/2014 19:18    Medications / Allergies:  Scheduled Meds: . antiseptic oral rinse  7 mL Mouth Rinse q12n4p  . cefTRIAXone (ROCEPHIN)  IV  2 g Intravenous Q24H   . chlorhexidine  15 mL Mouth Rinse BID  . enoxaparin (LOVENOX) injection  40 mg Subcutaneous Q24H  . metronidazole  500 mg Intravenous Q8H  . pantoprazole (PROTONIX) IV  40 mg Intravenous QHS   Continuous Infusions: . dextrose 5% lactated ringers with KCl 20 mEq/L 100 mL/hr at 02/20/14 1058   PRN Meds:.acetaminophen, HYDROcodone-acetaminophen, morphine injection, ondansetron  Antibiotics: Anti-infectives   Start     Dose/Rate Route Frequency Ordered Stop   02/18/14 2200  metroNIDAZOLE (FLAGYL) IVPB 500 mg     500 mg 100 mL/hr over 60 Minutes Intravenous Every 8 hours 02/18/14 2132     02/18/14 2200  cefTRIAXone (ROCEPHIN) 2 g in dextrose 5 % 50 mL IVPB     2 g 100 mL/hr over 30 Minutes Intravenous Every 24 hours 02/18/14 2132     02/18/14 2000  ciprofloxacin (CIPRO) IVPB 400 mg  Status:  Discontinued     400 mg 200 mL/hr over 60 Minutes Intravenous  Once 02/18/14 1959 02/18/14 2334   02/18/14 2000  metroNIDAZOLE (FLAGYL) IVPB 500 mg  Status:  Discontinued     500 mg 100 mL/hr over 60 Minutes Intravenous  Once 02/18/14 1959 02/18/14 2132       Assessment/Plan  Acute sigmoid diverticulitis with perforation--1st episode  -allow for sips of clears, if able to tolerate, clear liquid tray for dinner -continue with rocephin/flagyl  -SCD/loveox  -IV hydration  -mobilize  -pain control -will need a colonoscopy in 6-8 weeks  Emina Riebock, ANP-BC [facility] Pager [phone](7A-4:30P)   02/20/2014 11:27 AM    

## 2014-02-20 NOTE — Progress Notes (Signed)
Looks good. No fever, tachy, wbc Alert, nad Soft, nt, nd, no rt/guarding  Sips for lunch if no issues, clears tonight Cont IV abx   Mary SellaEric M. Andrey CampanileWilson, MD, FACS General, Bariatric, & Minimally Invasive Surgery [facility] [place]

## 2014-02-21 MED ORDER — SODIUM CHLORIDE 0.9 % IJ SOLN: 3.0000 mL | INTRAMUSCULAR | Status: DC | PRN

## 2014-02-21 MED ORDER — ACETAMINOPHEN 325 MG PO TABS: 325.0000 mg | ORAL_TABLET | Freq: Four times a day (QID) | ORAL | Status: DC | PRN

## 2014-02-21 MED ORDER — SODIUM CHLORIDE 0.9 % IJ SOLN: 3.0000 mL | Freq: Two times a day (BID) | INTRAMUSCULAR | Status: DC

## 2014-02-21 NOTE — Progress Notes (Signed)
Nutrition Education Note  RD consulted for nutrition education regarding diverticulitis. RD provided "Fiber Restricted Nutrition Therapy" and "High Fiber Nutrition Therapy" handout from the Academy of [employer] and Dietetics. Discussed different food groups and their fiber content, emphasizing gradual addition of higher fiber-containing foods as symptoms resolve after 4 weeks per MD recommendations. Provided list of goods to eat and foods to avoid. Teach back method used.  Expect good compliance.  Body mass index is 29.11 kg/(m^2). Patient meets criteria for overweight based on current BMI.   Current diet order is full liquid, patient reports eating well and had good appetite PTA, was eating 2 meals/day. Labs and medications reviewed. Alk phos, ALT, and total bilirubin elevated.   No nutrition interventions warranted at this time. If nutrition issues arise, please consult RD.    Carlis Stable MS, [place], LDN [phone] Pager [phone] Weekend/After Hours Pager

## 2014-02-21 NOTE — Progress Notes (Signed)
Patient ID: Paul Rodgers, male   DOB: 07/30/1985, 28 y.o.   MRN: [id number]     [facility]      [address]   [place] [address]    Phone: [phone] FAX: [phone]     Subjective: No pain.  No n/v.  Afebrile.   Ambulating.  Tolerating clears.    Objective:  Vital signs:  Filed Vitals:   02/20/14 1330 02/20/14 1400 02/20/14 2121 02/21/14 0616  BP: 112/63 116/67 110/58 102/61  Pulse: 73 79 74 63  Temp: 98.2 F (36.8 C) 97.5 F (36.4 C) 99.2 F (37.3 C) 98.4 F (36.9 C)  TempSrc: Oral Oral Oral Oral  Resp: 18 20 18 16   Height:      Weight:      SpO2: 100% 100% 99% 99%    Last BM Date: 02/16/14  Intake/Output   Yesterday:  09/29 0701 - 09/30 0700 In: 4356.7 [P.O.:1680; I.V.:2426.7; IV Piggyback:250] Out: 1800 [Urine:1800] This shift:    I/O last 3 completed shifts: In: 6223.3 [P.O.:1680; I.V.:4043.3; IV Piggyback:500] Out: 1800 [Urine:1800]   Physical Exam:  General: Pt awake/alert/oriented x4 in no acute distress  Chest: cta. No chest wall pain w good excursion  CV: Pulses intact. Regular rhythm  Abdomen: Soft. Nondistended. Non tender. No evidence of peritonitis. No incarcerated hernias.  Ext: SCDs BLE. No mjr edema. No cyanosis  Skin: No petechiae / purpura    Problem List:   Active Problems:   Sigmoid diverticulitis    Results:   Labs: Results for orders placed during the hospital encounter of 02/18/14 (from the past 48 hour(s))  CBC     Status: Abnormal   Collection Time    02/20/14  4:46 AM      Result Value Ref Range   WBC 5.2  4.0 - 10.5 K/uL   RBC 4.35  4.22 - 5.81 MIL/uL   Hemoglobin 12.7 (*) 13.0 - 17.0 g/dL   HCT 13.237.0 (*) 44.039.0 - 10.252.0 %   MCV 85.1  78.0 - 100.0 fL   MCH 29.2  26.0 - 34.0 pg   MCHC 34.3  30.0 - 36.0 g/dL   RDW 72.512.1  36.611.5 - 44.015.5 %   Platelets 167  150 - 400 K/uL    Imaging / Studies: No results found.  Medications / Allergies:  Scheduled Meds: .  antiseptic oral rinse  7 mL Mouth Rinse q12n4p  . cefTRIAXone (ROCEPHIN)  IV  2 g Intravenous Q24H  . chlorhexidine  15 mL Mouth Rinse BID  . enoxaparin (LOVENOX) injection  40 mg Subcutaneous Q24H  . metronidazole  500 mg Intravenous Q8H  . pantoprazole (PROTONIX) IV  40 mg Intravenous QHS  . sodium chloride  3 mL Intravenous Q12H   Continuous Infusions:  PRN Meds:.acetaminophen, HYDROcodone-acetaminophen, morphine injection, ondansetron, sodium chloride  Antibiotics: Anti-infectives   Start     Dose/Rate Route Frequency Ordered Stop   02/18/14 2200  metroNIDAZOLE (FLAGYL) IVPB 500 mg     500 mg 100 mL/hr over 60 Minutes Intravenous Every 8 hours 02/18/14 2132     02/18/14 2200  cefTRIAXone (ROCEPHIN) 2 g in dextrose 5 % 50 mL IVPB     2 g 100 mL/hr over 30 Minutes Intravenous Every 24 hours 02/18/14 2132     02/18/14 2000  ciprofloxacin (CIPRO) IVPB 400 mg  Status:  Discontinued     400 mg 200 mL/hr over 60 Minutes Intravenous  Once 02/18/14 1959 02/18/14 2334  02/18/14 2000  metroNIDAZOLE (FLAGYL) IVPB 500 mg  Status:  Discontinued     500 mg 100 mL/hr over 60 Minutes Intravenous  Once 02/18/14 1959 02/18/14 2132        Assessment/Plan Acute sigmoid diverticulitis with perforation--1st episode  -full liquid diet -continue with rocephin/flagyl  -SCD/loveox  -ISLIV -mobilize  -pain control  -will need a colonoscopy in 6-8 weeks -anticipate DC in AM if he continues to progress   Paul Rodgers, [facility] Pager [phone](7A-4:30P)   02/21/2014 9:48 AM

## 2014-02-21 NOTE — Discharge Instructions (Signed)
Diverticulitis °Diverticulitis is when small pockets that have formed in your colon (large intestine) become infected or swollen. °HOME CARE °· Follow your doctor's instructions. °· Follow a special diet if told by your doctor. °· When you feel better, your doctor may tell you to change your diet. You may be told to eat a lot of fiber. Fruits and vegetables are good sources of fiber. Fiber makes it easier to poop (have bowel movements). °· Take supplements or probiotics as told by your doctor. °· Only take medicines as told by your doctor. °· Keep all follow-up visits with your doctor. °GET HELP IF: °· Your pain does not get better. °· You have a hard time eating food. °· You are not pooping like normal. °GET HELP RIGHT AWAY IF: °· Your pain gets worse. °· Your problems do not get better. °· Your problems suddenly get worse. °· You have a fever. °· You keep throwing up (vomiting). °· You have bloody or black, tarry poop (stool). °MAKE SURE YOU:  °· Understand these instructions. °· Will watch your condition. °· Will get help right away if you are not doing well or get worse. °Document Released: 10/28/2007 Document Revised: 05/16/2013 Document Reviewed: 04/05/2013 °ExitCare® Patient Information ©2015 [employer]. This information is not intended to replace advice given to you by your health care provider. Make sure you discuss any questions you have with your health care provider. ° °

## 2014-02-21 NOTE — Progress Notes (Signed)
Alert, nad No c/o Soft, nt, nd  Stay on fulls today Adv diet to solids in am Probable dc Thursday with oral abx (total 2 weeks of abx)  Mary SellaEric M. Andrey CampanileWilson, MD, FACS General, Bariatric, & Minimally Invasive Surgery [facility] [place]

## 2014-02-22 MED ORDER — METRONIDAZOLE 500 MG PO TABS: 500.0000 mg | ORAL_TABLET | Freq: Three times a day (TID) | ORAL | Status: AC

## 2014-02-22 MED ORDER — CIPROFLOXACIN HCL 500 MG PO TABS: 500.0000 mg | ORAL_TABLET | Freq: Two times a day (BID) | ORAL | Status: AC

## 2014-02-22 NOTE — ED Provider Notes (Signed)
Medical screening examination/treatment/procedure(s) were conducted as a shared visit with non-physician practitioner(s) and myself.  I personally evaluated the patient during the encounter.   EKG Interpretation None       Doug SouSam Elna Radovich, MD 02/22/14 [phone]

## 2014-02-22 NOTE — Discharge Summary (Signed)
Physician Discharge Summary  Paul BasqueJoshua I Krahn ZOX:[id number] DOB: 06/24/1985 DOA: 02/18/2014  PCP: No PCP Per Patient  Consultation: none  Admit date: 02/18/2014 Discharge date: 02/22/2014  Recommendations for Outpatient Follow-up:    Follow-up Information   Follow up with [facility]    . Call today. (Call at 9:05 am)    Contact information:   [address] [place] [address] [phone]      Follow up with Adolph PollackOSENBOWER,TODD J, MD In 2 weeks.   Specialty:  General Surgery   Contact information:   [address] [place] [address] [phone]      Discharge Diagnoses:  1. Acute sigmoid diverticulitis with perforation    Surgical Procedure: none  Discharge Condition: stable Disposition: home  Diet recommendation: low fiber   Filed Weights   02/18/14 1934 02/19/14 0017  Weight: 180 lb (81.647 kg) 177 lb 11.2 oz (80.604 kg)     Filed Vitals:   02/22/14 0607  BP: 98/59  Pulse: 63  Temp: 98.3 F (36.8 C)  Resp: 18     Hospital Course:  Paul RegisterJoshua Rodgers is a 28 year old male who [facility].  A CT scan in the hospital and this demonstrated findings consistent with acute diverticulitis with a microperforation and a small amount of air around the inflamed segment of sigmoid colon.  He was admitted for IV antibiotics and bowel rest which he responded to well.    He remained afebrile with a normal white count.  His diet was advanced.  He had a dietician consult while inpatient as well.  On HD#4 the patient was pain free, tolerating a low fiber diet, having BMs and therefore felt stable for discharge.  He was given cipro and flagyl x10 additional days to complete his course.  He was advised to schedule a follow up with Dr. Abbey Chattersosenbower in 2-3 weeks.  He will need a colonoscopy in 6-8 weeks.  We discussed warning signs that warrant immediate attention. Medication risks, benefits and therapeutic  alternatives were reviewed with the patient.  He verbalizes understanding. He was encouraged to call with questions or concerns.     Physical Exam:  General: Pt awake/alert/oriented x4 in no acute distress  Chest: cta. No chest wall pain w good excursion  CV: Pulses intact. Regular rhythm  Abdomen: Soft. Nondistended. Non tender. No evidence of peritonitis. No incarcerated hernias.  Ext: SCDs BLE. No mjr edema. No cyanosis  Skin: No petechiae / purpura   Discharge Instructions     Medication List    STOP taking these medications       dicyclomine 10 MG capsule  Commonly known as:  BENTYL      TAKE these medications       acetaminophen 500 MG tablet  Commonly known as:  TYLENOL  Take 500 mg by mouth every 6 (six) hours as needed.     ciprofloxacin 500 MG tablet  Commonly known as:  CIPRO  Take 1 tablet (500 mg total) by mouth 2 (two) times daily.     metroNIDAZOLE 500 MG tablet  Commonly known as:  FLAGYL  Take 1 tablet (500 mg total) by mouth 3 (three) times daily.     multivitamin with minerals Tabs tablet  Take 1 tablet by mouth daily.     ondansetron 4 MG disintegrating tablet  Commonly known as:  ZOFRAN ODT  Take 1 tablet (4 mg total) by mouth every  8 (eight) hours as needed for nausea or vomiting.           Follow-up Information   Follow up with [facility]    . Call today. (Call at 9:05 am)    Contact information:   [address] [place] [address] [phone]      Follow up with Adolph Pollack, MD In 2 weeks.   Specialty:  General Surgery   Contact information:   [address] [place] [address] [phone]        The results of significant diagnostics from this hospitalization (including imaging, microbiology, ancillary and laboratory) are listed below for reference.    Significant Diagnostic Studies: Ct Abdomen Pelvis W Contrast  02/18/2014   CLINICAL DATA:  Right lower  quadrant abdominal pain.  EXAM: CT ABDOMEN AND PELVIS WITH CONTRAST  TECHNIQUE: Multidetector CT imaging of the abdomen and pelvis was performed using the standard protocol following bolus administration of intravenous contrast.  CONTRAST:  50mL OMNIPAQUE IOHEXOL 300 MG/ML SOLN, OMNIPAQUE IOHEXOL 300 MG/ML SOLN  COMPARISON:  None.  FINDINGS: Visualized lung bases appear normal. No significant osseous abnormality is noted.  No gallstones are noted. Fatty infiltration of the liver is noted. The spleen and pancreas appear normal. Adrenal glands and kidneys appear normal. No hydronephrosis or renal obstruction is noted. Moderate urinary bladder distention is noted. The appendix appears normal. Focal diverticulitis of the proximal sigmoid colon is noted with moderate amount of extraluminal gas seen posteriorly and superiorly consistent with small perforation no definite abnormal fluid collection or abscess is seen at this time. No significant adenopathy is noted.  IMPRESSION: Fatty infiltration of the liver.  Moderate distention of the urinary bladder.  Normal appendix.  Focal diverticulitis is seen involving the proximal sigmoid colon with moderate amount of gas adjacent to the inflamed bowel consistent with perforation. No definite abscess or defined fluid collection is seen at this time. Critical Value/emergent results were called by telephone at the time of interpretation on 02/18/2014 at 7:17 pm to Dr. Norberto Sorenson , who verbally acknowledged these results.   Electronically Signed   By: Roque Lias M.D.   On: 02/18/2014 19:18    Microbiology: No results found for this or any previous visit (from the past 240 hour(s)).   Labs: Basic Metabolic Panel:  Recent Labs Lab 02/18/14 2001  NA 134*  K 3.8  CL 95*  CO2 22  GLUCOSE 103*  BUN 13  CREATININE 1.09  CALCIUM 9.1   Liver Function Tests:  Recent Labs Lab 02/18/14 2001  AST 30  ALT 63*  ALKPHOS 138*  BILITOT 1.5*  PROT 7.6  ALBUMIN 3.7    No results found for this basename: LIPASE, AMYLASE,  in the last 168 hours No results found for this basename: AMMONIA,  in the last 168 hours CBC:  Recent Labs Lab 02/18/14 2001 02/19/14 0435 02/20/14 0446  WBC 7.7 6.8 5.2  NEUTROABS 6.3  --   --   HGB 14.2 13.1 12.7*  HCT 40.8 38.0* 37.0*  MCV 84.0 85.8 85.1  PLT 162 145* 167   Cardiac Enzymes: No results found for this basename: CKTOTAL, CKMB, CKMBINDEX, TROPONINI,  in the last 168 hours BNP: BNP (last 3 results) No results found for this basename: PROBNP,  in the last 8760 hours CBG: No results found for this basename: GLUCAP,  in the last 168 hours  Active Problems:   Sigmoid diverticulitis  Time coordinating discharge: <30 mins  Signed:  Krystan Northrop, ANP-BC

## 2014-02-22 NOTE — Discharge Summary (Signed)
Paul Trimm [name], MD, FACS General, Bariatric, & Minimally Invasive Surgery [facility] [place]  

## 2014-02-26 ENCOUNTER — Encounter: Payer: Self-pay | Admitting: Family Medicine

## 2014-02-26 ENCOUNTER — Ambulatory Visit: Payer: Self-pay | Attending: Family Medicine | Admitting: Family Medicine

## 2014-02-26 VITALS — BP 123/77 | HR 66 | Temp 97.9°F | Resp 18 | Ht 65.0 in | Wt 178.0 lb

## 2014-02-26 DIAGNOSIS — K5732 Diverticulitis of large intestine without perforation or abscess without bleeding: Secondary | ICD-10-CM

## 2014-02-26 LAB — COMPLETE METABOLIC PANEL WITH GFR
ALBUMIN: 4.4 g/dL (ref 3.5–5.2)
ALT: 76 U/L — AB (ref 0–53)
AST: 46 U/L — ABNORMAL HIGH (ref 0–37)
Alkaline Phosphatase: 80 U/L (ref 39–117)
BILIRUBIN TOTAL: 0.3 mg/dL (ref 0.2–1.2)
BUN: 15 mg/dL (ref 6–23)
CALCIUM: 9.5 mg/dL (ref 8.4–10.5)
CHLORIDE: 104 meq/L (ref 96–112)
CO2: 27 mEq/L (ref 19–32)
Creat: 1.09 mg/dL (ref 0.50–1.35)
GLUCOSE: 90 mg/dL (ref 70–99)
POTASSIUM: 4.5 meq/L (ref 3.5–5.3)
Sodium: 138 mEq/L (ref 135–145)
Total Protein: 6.8 g/dL (ref 6.0–8.3)

## 2014-02-26 NOTE — Assessment & Plan Note (Addendum)
A: improving P: Finish Abx Repeat CMP Provided application for [employer] discount card  General surgery f/u in 2 weeks per discharge summary, referral placed.

## 2014-02-26 NOTE — Progress Notes (Signed)
Establish Care HFU Diverticulitis, no pain doing good

## 2014-02-26 NOTE — Progress Notes (Signed)
   Subjective:    Patient ID: Paul Rodgers, male    DOB: 08/22/1985, 28 y.o.   MRN: [id number] CC: HFU for sigmoid diverticulitis.  HPI 28 year old male presents for hospital followup is with the following:  #1 sigmoid diverticulitis: Patient's compliant with antibiotics. Denies abdominal pain, fever, diarrhea, nausea, vomiting.  Soc hx: non smoker  Review of Systems As per HPI     Objective:   Physical Exam BP 123/77  Pulse 66  Temp(Src) 97.9 F (36.6 C) (Oral)  Resp 18  Ht 5\' 5"  (1.651 m)  Wt 178 lb (80.74 kg)  BMI 29.62 kg/m2  SpO2 100% General appearance: alert, cooperative and no distress Abdomen: soft, non-tender; bowel sounds normal; no masses,  no organomegaly       Assessment & Plan:

## 2014-02-26 NOTE — Patient Instructions (Signed)
Mr. Paul Rodgers,   Thank you for coming back in today.  You will be called with lab results.  Please fill out paperwork and bring back to meet with the financial counselor.  Dr. Armen PickupFunches

## 2014-02-28 ENCOUNTER — Telehealth: Payer: Self-pay | Admitting: *Deleted

## 2014-02-28 NOTE — Telephone Encounter (Signed)
Left voice message to return call 

## 2014-02-28 NOTE — Telephone Encounter (Signed)
Message copied by Dyann KiefGIRALDEZ, Jaydon Avina M on Wed Feb 28, 2014  4:36 PM ------      Message from: Dessa PhiFUNCHES, JOSALYN      Created: Tue Feb 27, 2014 11:58 AM       F/u CMP improved. Now with slight elevated liver transaminases only.       Will f/u in about  8-12 weeks,      Continue to avoid alcohol. ------

## 2014-03-01 ENCOUNTER — Telehealth: Payer: Self-pay

## 2014-03-01 NOTE — Telephone Encounter (Signed)
Patient called requesting paper work for leave of absence to be filled out Advised patient that it would not be done same day Patient weill drop off paperwork in the am

## 2014-03-05 NOTE — Telephone Encounter (Signed)
Form filled out and placed up front for pick up  

## 2015-11-26 IMAGING — CT CT ABD-PELV W/ CM
1 of 2 series · 15 of 32 positions shown, 19 images · IV contrast (OMNIPAQUE 300)
Comparison: None.

CLINICAL DATA: Right lower quadrant abdominal pain.

EXAM:
CT ABDOMEN AND PELVIS WITH CONTRAST
TECHNIQUE: Multidetector CT imaging of the abdomen and pelvis was performed
using the standard protocol following bolus administration of
intravenous contrast.
CONTRAST:  50mL OMNIPAQUE IOHEXOL 300 MG/ML SOLN, 100mL OMNIPAQUE
IOHEXOL 300 MG/ML SOLN

[Series 2: abd/pel with · axial · 0.74mm/px · z∈[-476,-36]mm · 15 of 98 slices shown, 19 images]
[im 5/98  soft-tissue]
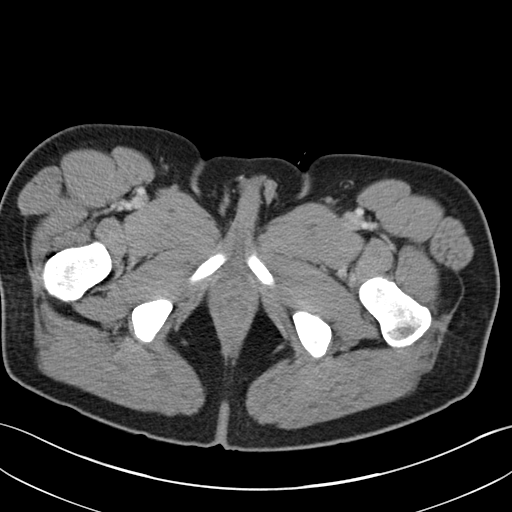
[im 5/98  bone]
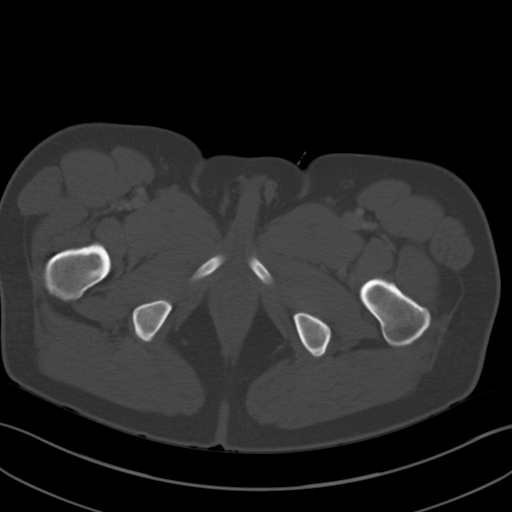
[im 13/98  soft-tissue]
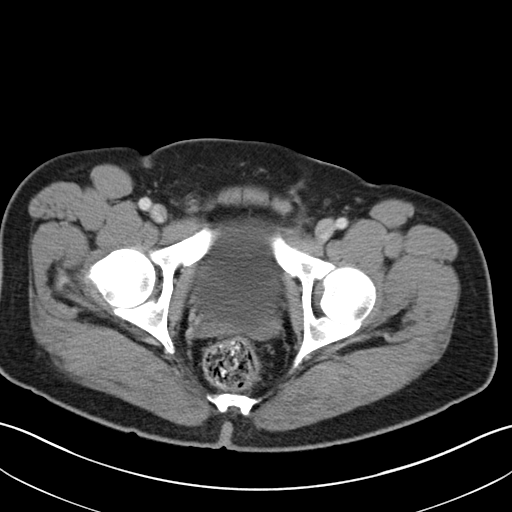
[im 21/98  soft-tissue]
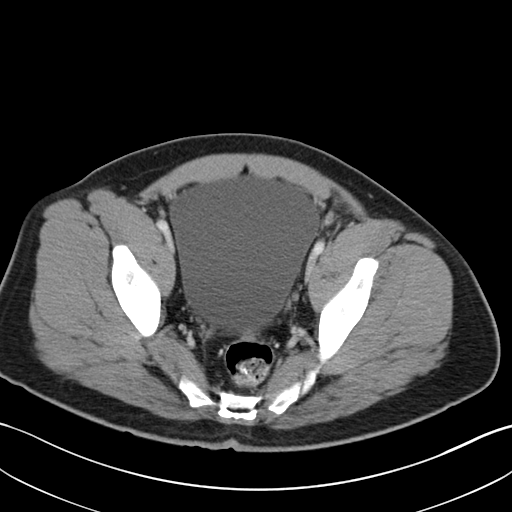
[im 29/98  soft-tissue]
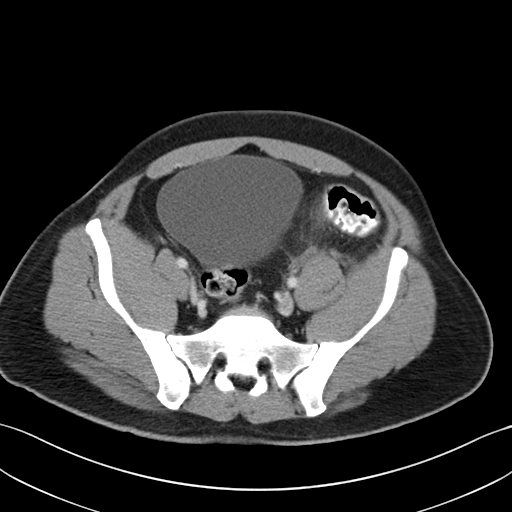
[im 33/98  soft-tissue]
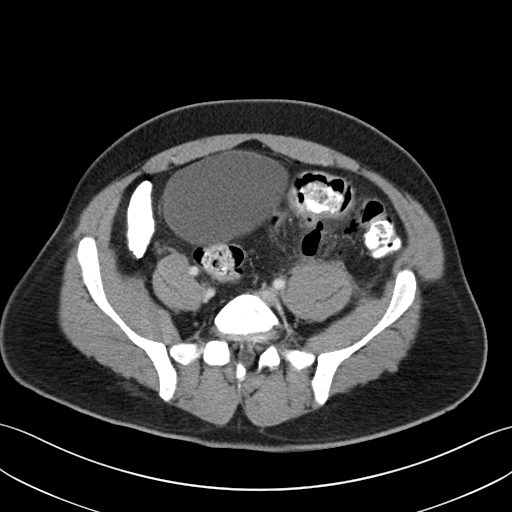
[im 41/98  soft-tissue]
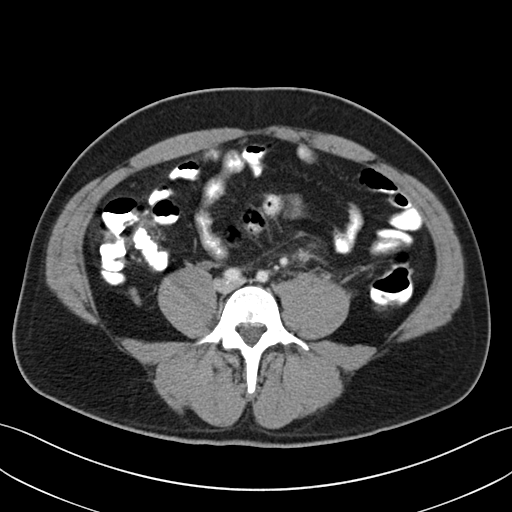
[im 49/98  soft-tissue]
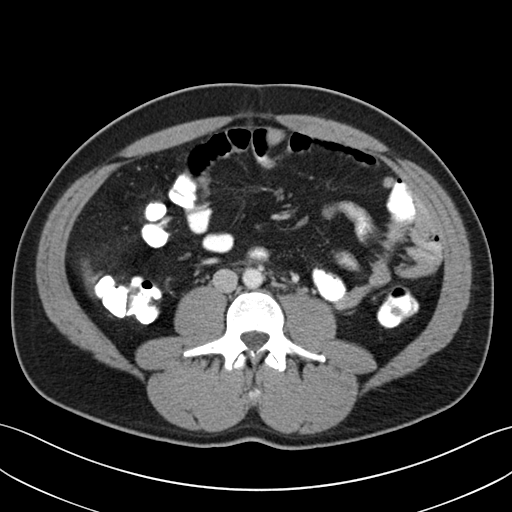
[im 57/98  soft-tissue]
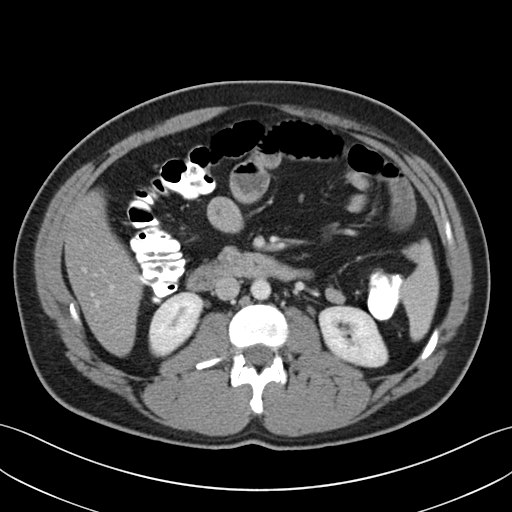
[im 65/98  soft-tissue]
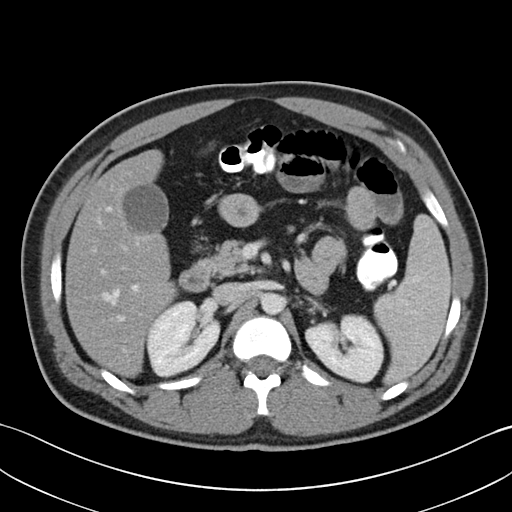
[im 65/98  bone]
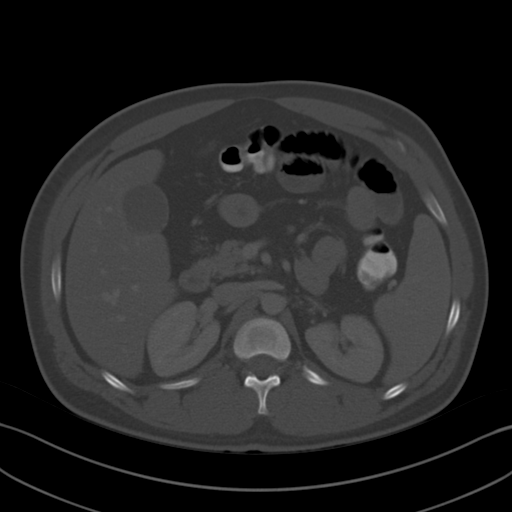
[im 69/98  soft-tissue]
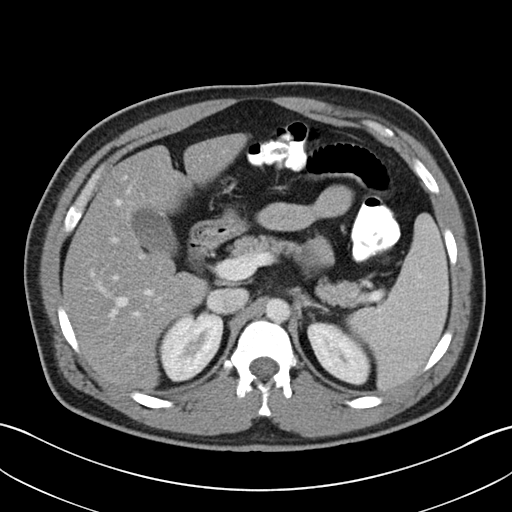
[im 77/98  soft-tissue]
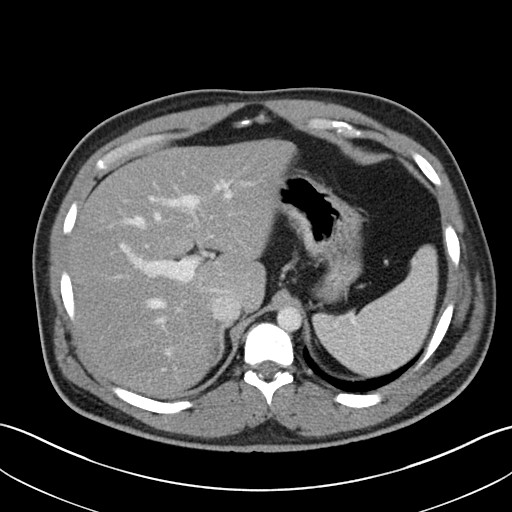
[im 81/98  lung]
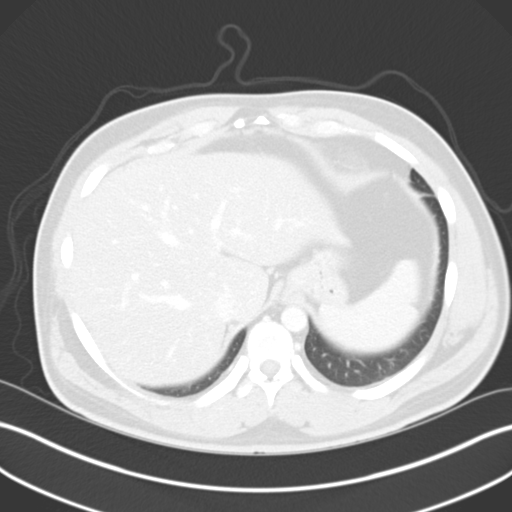
[im 85/98  soft-tissue]
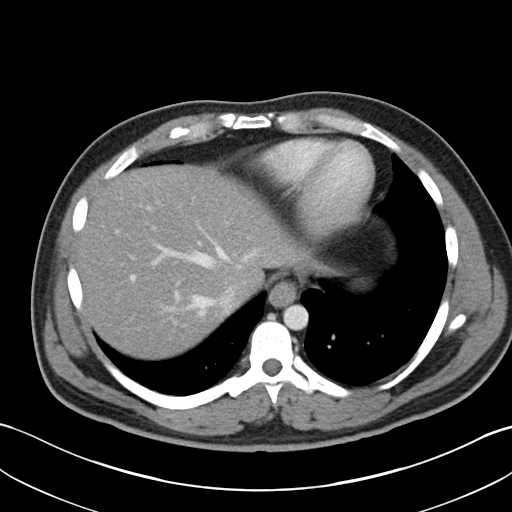
[im 85/98  lung]
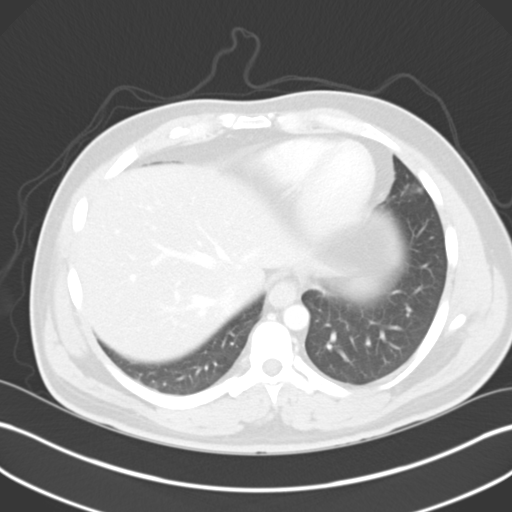
[im 89/98  lung]
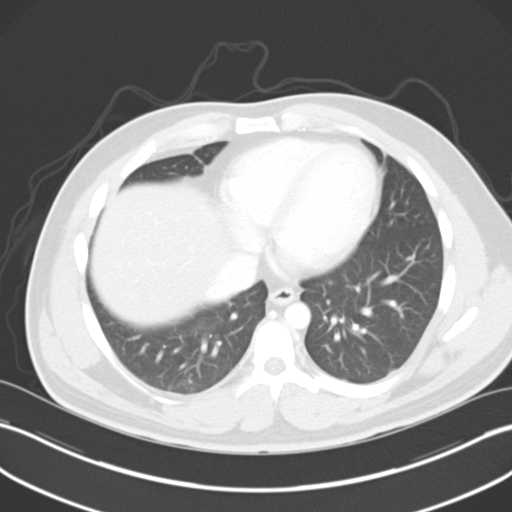
[im 93/98  soft-tissue]
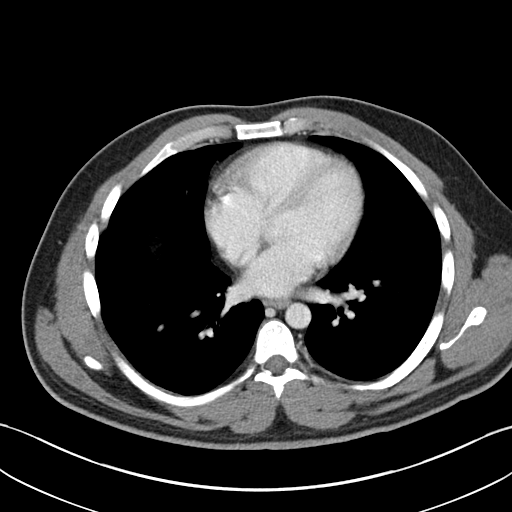
[im 93/98  lung]
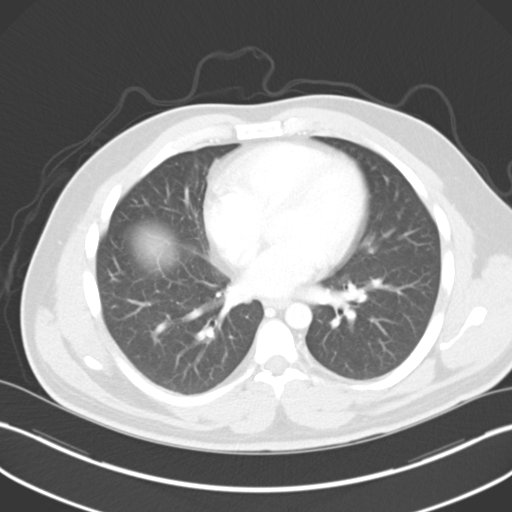

[15 of 32 positions shown; findings below may reference images not displayed]

FINDINGS: Visualized lung bases appear normal. No significant osseous
abnormality is noted.

No gallstones are noted. Fatty infiltration of the liver is noted.
The spleen and pancreas appear normal. Adrenal glands and kidneys
appear normal. No hydronephrosis or renal obstruction is noted.
Moderate urinary bladder distention is noted. The appendix appears
normal. Focal diverticulitis of the proximal sigmoid colon is noted
with moderate amount of extraluminal gas seen posteriorly and
superiorly consistent with small perforation no definite abnormal
fluid collection or abscess is seen at this time. No significant
adenopathy is noted.
IMPRESSION: Fatty infiltration of the liver.

Moderate distention of the urinary bladder.

Normal appendix.

Focal diverticulitis is seen involving the proximal sigmoid colon
with moderate amount of gas adjacent to the inflamed bowel
consistent with perforation. No definite abscess or defined fluid
collection is seen at this time. Critical Value/emergent results
were called by telephone at the time of interpretation on 02/18/2014
at [DATE] to Dr. SUNNY BOY NADINE , who verbally acknowledged these
results.

## 2017-06-16 ENCOUNTER — Other Ambulatory Visit: Payer: Self-pay

## 2017-06-16 ENCOUNTER — Encounter (HOSPITAL_BASED_OUTPATIENT_CLINIC_OR_DEPARTMENT_OTHER): Payer: Self-pay

## 2017-06-16 ENCOUNTER — Emergency Department (HOSPITAL_BASED_OUTPATIENT_CLINIC_OR_DEPARTMENT_OTHER)
Admission: EM | Admit: 2017-06-16 | Discharge: 2017-06-16 | Disposition: A | Discharge status: Home / Self Care | Payer: Self-pay | Attending: Emergency Medicine | Admitting: Emergency Medicine

## 2017-06-16 DIAGNOSIS — R05 Cough: Secondary | ICD-10-CM | POA: Insufficient documentation

## 2017-06-16 DIAGNOSIS — B9789 Other viral agents as the cause of diseases classified elsewhere: Secondary | ICD-10-CM | POA: Insufficient documentation

## 2017-06-16 DIAGNOSIS — J3489 Other specified disorders of nose and nasal sinuses: Secondary | ICD-10-CM | POA: Insufficient documentation

## 2017-06-16 DIAGNOSIS — R5383 Other fatigue: Secondary | ICD-10-CM | POA: Insufficient documentation

## 2017-06-16 DIAGNOSIS — J069 Acute upper respiratory infection, unspecified: Secondary | ICD-10-CM | POA: Insufficient documentation

## 2017-06-16 HISTORY — DX: Diverticulitis of intestine, part unspecified, without perforation or abscess without bleeding: K57.92

## 2017-06-16 LAB — RAPID STREP SCREEN (MED CTR MEBANE ONLY): Streptococcus, Group A Screen (Direct): NEGATIVE

## 2017-06-16 MED ORDER — BENZONATATE 100 MG PO CAPS: 100.0000 mg | ORAL_CAPSULE | Freq: Three times a day (TID) | ORAL | 0 refills | Status: DC

## 2017-06-16 NOTE — Discharge Instructions (Signed)
Please read attached information. If you experience any new or worsening signs or symptoms please return to the emergency room for evaluation. Please follow-up with your primary care provider or specialist as discussed. Please use medication prescribed only as directed and discontinue taking if you have any concerning signs or symptoms.   °

## 2017-06-16 NOTE — ED Triage Notes (Signed)
C/o flu like sx started yesterday-NAD-steady gait 

## 2017-06-16 NOTE — ED Provider Notes (Signed)
[facility] EMERGENCY DEPARTMENT Provider Note   CSN: [id number] Arrival date & time: 06/16/17  1100     History   Chief Complaint Chief Complaint  Patient presents with  . Cough    HPI Paul Rodgers is a 32 y.o. male.  HPI   32 year old male presents today with complaints of upper respiratory infection.  Patient notes that yesterday he was here in the emergency room with his wife who was diagnosed with strep throat.  He notes today waking up with sore throat, dry nonproductive cough, rhinorrhea and fatigue.  Patient denies any productive cough, denies any shortness of breath or chest pain.  He denies any difficulty swallowing.  Patient is a non-smoker, with no significant past medical history.  No medications prior to arrival here today.  She did not receive the influenza vaccine this year.  Past Medical History:  Diagnosis Date  . Diverticulitis     Patient Active Problem List   Diagnosis Date Noted  . Sigmoid diverticulitis 02/18/2014    Past Surgical History:  Procedure Laterality Date  . HERNIA REPAIR     At age of 1       Home Medications    Prior to Admission medications   Medication Sig Start Date End Date Taking? Authorizing Provider  Cetirizine HCl (ZYRTEC PO) Take by mouth.   Yes [provider]  benzonatate (TESSALON) 100 MG capsule Take 1 capsule (100 mg total) by mouth every 8 (eight) hours. 06/16/17   Eyvonne Mechanic, PA-C    Family History No family history on file.  Social History Social History   Tobacco Use  . Smoking status: Never Smoker  . Smokeless tobacco: Never Used  Substance Use Topics  . Alcohol use: Yes    Comment: occ  . Drug use: No     Allergies   Patient has no known allergies.   Review of Systems Review of Systems  All other systems reviewed and are negative.    Physical Exam Updated Vital Signs BP 129/88 (BP Location: Left Arm)   Pulse 97   Temp 100.2 F (37.9 C) (Oral)   Resp 18    Ht 5\' 7"  (1.702 m)   Wt 83.5 kg (184 lb)   SpO2 99%   BMI 28.82 kg/m   Physical Exam  Constitutional: He is oriented to person, place, and time. He appears well-developed and well-nourished.  HENT:  Head: Normocephalic and atraumatic.  Mouth/Throat: Uvula is midline, oropharynx is clear and moist and mucous membranes are normal. No oropharyngeal exudate, posterior oropharyngeal edema, posterior oropharyngeal erythema or tonsillar abscesses. Tonsils are 0 on the right. Tonsils are 0 on the left. No tonsillar exudate.  Eyes: Conjunctivae are normal. Pupils are equal, round, and reactive to light. Right eye exhibits no discharge. Left eye exhibits no discharge. No scleral icterus.  Neck: Normal range of motion. No JVD present. No tracheal deviation present.  Cardiovascular: Normal rate, regular rhythm and normal heart sounds.  No murmur heard. Pulmonary/Chest: Effort normal and breath sounds normal. No stridor. No respiratory distress. He has no wheezes. He has no rales. He exhibits no tenderness.  Neurological: He is alert and oriented to person, place, and time. Coordination normal.  Psychiatric: He has a normal mood and affect. His behavior is normal. Judgment and thought content normal.  Nursing note and vitals reviewed.    ED Treatments / Results  Labs (all labs ordered are listed, but only abnormal results are displayed) Labs Reviewed  RAPID  STREP SCREEN ([facility])  CULTURE, GROUP A STREP [facility])    EKG  EKG Interpretation None       Radiology No results found.  Procedures Procedures (including critical care time)  Medications Ordered in ED Medications - No data to display   Initial Impression / Assessment and Plan / ED Course  I have reviewed the triage vital signs and the nursing notes.  Pertinent labs & imaging results that were available during my care of the patient were reviewed by me and considered in my medical decision making (see chart for  details).      Final Clinical Impressions(s) / ED Diagnoses   Final diagnoses:  Viral URI with cough    Labs:  Rapid strep  Imaging:  Consults:  Therapeutics:  Discharge Meds: Tessalon  Assessment/Plan: 32 year old male presents today with likely viral URI.  He is well-appearing no acute distress.  Symptoms started this morning.  He has no shortness of breath, lung sounds are clear with reassuring vital signs.  He has a low-grade temperature here.  I have low suspicion for acute pneumonia in this patient, low suspicion for influenza, low suspicion for strep pharyngitis.  Patient has no significant risk factors that would put him at greater risk for poor outcome.  He does have close sick contacts though strep was tested this was negative.  Patient given symptomatic care instructions detailed strict return precautions, he verbalized understanding and agreement to today's plan had no further questions or concerns at the time of discharge.     ED Discharge Orders        Ordered    benzonatate (TESSALON) 100 MG capsule  Every 8 hours     06/16/17 1213       Eyvonne MechanicHedges, Tonji Elliff, PA-C 06/16/17 1217    Jacalyn LefevreHaviland, Julie, MD 06/16/17 1515

## 2017-06-18 LAB — CULTURE, GROUP A STREP (THRC)

## 2018-05-25 DIAGNOSIS — G510 Bell's palsy: Secondary | ICD-10-CM

## 2018-05-25 HISTORY — DX: Bell's palsy: G51.0

## 2019-05-01 ENCOUNTER — Encounter (HOSPITAL_COMMUNITY): Payer: Self-pay

## 2019-05-01 ENCOUNTER — Emergency Department (HOSPITAL_COMMUNITY)
Admission: EM | Admit: 2019-05-01 | Discharge: 2019-05-01 | Disposition: A | Discharge status: Home / Self Care | Payer: BC Managed Care – PPO | Attending: Emergency Medicine | Admitting: Emergency Medicine

## 2019-05-01 ENCOUNTER — Other Ambulatory Visit: Payer: Self-pay

## 2019-05-01 ENCOUNTER — Emergency Department (HOSPITAL_COMMUNITY): Payer: BC Managed Care – PPO

## 2019-05-01 DIAGNOSIS — R1030 Lower abdominal pain, unspecified: Secondary | ICD-10-CM | POA: Diagnosis present

## 2019-05-01 DIAGNOSIS — Z79899 Other long term (current) drug therapy: Secondary | ICD-10-CM | POA: Insufficient documentation

## 2019-05-01 DIAGNOSIS — K5792 Diverticulitis of intestine, part unspecified, without perforation or abscess without bleeding: Secondary | ICD-10-CM | POA: Insufficient documentation

## 2019-05-01 LAB — COMPREHENSIVE METABOLIC PANEL
ALT: 48 U/L — ABNORMAL HIGH (ref 0–44)
AST: 27 U/L (ref 15–41)
Albumin: 4.2 g/dL (ref 3.5–5.0)
Alkaline Phosphatase: 90 U/L (ref 38–126)
Anion gap: 11 (ref 5–15)
BUN: 16 mg/dL (ref 6–20)
CO2: 24 mmol/L (ref 22–32)
Calcium: 9.3 mg/dL (ref 8.9–10.3)
Chloride: 104 mmol/L (ref 98–111)
Creatinine, Ser: 0.85 mg/dL (ref 0.61–1.24)
GFR calc Af Amer: >60 mL/min (ref >60)
GFR calc non Af Amer: >60 mL/min (ref >60)
Glucose, Bld: 91 mg/dL (ref 70–99)
Potassium: 3.8 mmol/L (ref 3.5–5.1)
Sodium: 139 mmol/L (ref 135–145)
Total Bilirubin: 0.5 mg/dL (ref 0.3–1.2)
Total Protein: 7.7 g/dL (ref 6.5–8.1)

## 2019-05-01 LAB — CBC
HCT: 43.8 % (ref 39.0–52.0)
Hemoglobin: 14.8 g/dL (ref 13.0–17.0)
MCH: 29.6 pg (ref 26.0–34.0)
MCHC: 33.8 g/dL (ref 30.0–36.0)
MCV: 87.6 fL (ref 80.0–100.0)
Platelets: 206 10*3/uL (ref 150–400)
RBC: 5 MIL/uL (ref 4.22–5.81)
RDW: 11.9 % (ref 11.5–15.5)
WBC: 6.3 10*3/uL (ref 4.0–10.5)
nRBC: 0 % (ref 0.0–0.2)

## 2019-05-01 LAB — LIPASE, BLOOD: Lipase: 26 U/L (ref 11–51)

## 2019-05-01 MED ORDER — IOHEXOL 300 MG/ML  SOLN
100.0000 mL | Freq: Once | INTRAMUSCULAR | Status: AC | PRN
  Administered 2019-05-01: 100 mL via INTRAVENOUS

## 2019-05-01 MED ORDER — SODIUM CHLORIDE 0.9% FLUSH
3.0000 mL | Freq: Once | INTRAVENOUS | Status: AC
  Administered 2019-05-01: 3 mL via INTRAVENOUS

## 2019-05-01 MED ORDER — ONDANSETRON HCL 4 MG PO TABS: 4.0000 mg | ORAL_TABLET | Freq: Three times a day (TID) | ORAL | 0 refills | Status: DC | PRN

## 2019-05-01 MED ORDER — CIPROFLOXACIN HCL 500 MG PO TABS
500.0000 mg | ORAL_TABLET | Freq: Once | ORAL | Status: AC
  Administered 2019-05-01: 500 mg via ORAL
  Filled 2019-05-01: qty 1

## 2019-05-01 MED ORDER — METRONIDAZOLE 500 MG PO TABS: 500.0000 mg | ORAL_TABLET | Freq: Three times a day (TID) | ORAL | 0 refills | Status: AC

## 2019-05-01 MED ORDER — MORPHINE SULFATE (PF) 4 MG/ML IV SOLN
4.0000 mg | Freq: Once | INTRAVENOUS | Status: AC
  Administered 2019-05-01: 4 mg via INTRAVENOUS
  Filled 2019-05-01: qty 1

## 2019-05-01 MED ORDER — METRONIDAZOLE 500 MG PO TABS
500.0000 mg | ORAL_TABLET | Freq: Once | ORAL | Status: AC
  Administered 2019-05-01: 500 mg via ORAL
  Filled 2019-05-01: qty 1

## 2019-05-01 MED ORDER — CIPROFLOXACIN HCL 500 MG PO TABS: 500.0000 mg | ORAL_TABLET | Freq: Two times a day (BID) | ORAL | 0 refills | Status: AC

## 2019-05-01 NOTE — ED Notes (Signed)
Provided patient Sprite for PO challenge.

## 2019-05-01 NOTE — ED Provider Notes (Signed)
[facility]-EMERGENCY DEPT Provider Note   CSN: [id number] Arrival date & time: 05/01/19  1617     History   Chief Complaint Chief Complaint  Patient presents with   Abdominal Pain    HPI Paul Rodgers is a 33 y.o. male with history of peripheral diverticulitis presents for evaluation of acute onset, progressively worsening lower abdominal pain for 4 days.  Reports symptoms began with constipation and lower abdominal pressure sensation.  He took an over-the-counter laxative and had a small bowel movement but has not had any bowel movement since then.  Denies nausea, vomiting, urinary symptoms, melena, hematochezia, fevers, chest pain, or shortness of breath.  Reports he will have waves of sharp pain in the left lower quadrant of the abdomen which worsen with certain movements, laughter, cough.  He went to an urgent care 3 days ago who started him on Augmentin for presumed diverticulitis but his symptoms have persisted and worsened so he presents to the ED for further evaluation.  Reports this feels similar to the last time he had diverticulitis just less severe.     The history is provided by the patient.    Past Medical History:  Diagnosis Date   Diverticulitis     Patient Active Problem List   Diagnosis Date Noted   Sigmoid diverticulitis 02/18/2014    Past Surgical History:  Procedure Laterality Date   HERNIA REPAIR     At age of 1        Home Medications    Prior to Admission medications   Medication Sig Start Date End Date Taking? Authorizing Provider  amoxicillin-clavulanate (AUGMENTIN) 875-125 MG tablet Take 1 tablet by mouth 2 (two) times daily. 04/29/19  Yes [provider]  Multiple Vitamins-Minerals (ZINC PO) Take 1 tablet by mouth daily.   Yes [provider]  benzonatate (TESSALON) 100 MG capsule Take 1 capsule (100 mg total) by mouth every 8 (eight) hours. Patient not taking: Reported on 05/01/2019 06/16/17    Hedges, Tinnie Gens, PA-C  ciprofloxacin (CIPRO) 500 MG tablet Take 1 tablet (500 mg total) by mouth every 12 (twelve) hours for 10 days. 05/01/19 05/11/19  Michela Pitcher A, PA-C  metroNIDAZOLE (FLAGYL) 500 MG tablet Take 1 tablet (500 mg total) by mouth 3 (three) times daily for 10 days. 05/01/19 05/11/19  Michela Pitcher A, PA-C  ondansetron (ZOFRAN) 4 MG tablet Take 1 tablet (4 mg total) by mouth every 8 (eight) hours as needed for nausea or vomiting. 05/01/19   Jeanie Sewer, PA-C    Family History Family History  Problem Relation Age of Onset   Hypertension Father     Social History Social History   Tobacco Use   Smoking status: Never Smoker   Smokeless tobacco: Never Used  Substance Use Topics   Alcohol use: Yes    Comment: occ   Drug use: No     Allergies   Patient has no known allergies.   Review of Systems Review of Systems  Constitutional: Negative for chills and fever.  Respiratory: Negative for shortness of breath.   Cardiovascular: Negative for chest pain.  Gastrointestinal: Positive for abdominal pain and constipation. Negative for diarrhea, nausea and vomiting.  Genitourinary: Negative for dysuria, frequency, hematuria and urgency.  All other systems reviewed and are negative.    Physical Exam Updated Vital Signs BP 115/79 (BP Location: Right Arm)    Pulse 75    Temp 99 F (37.2 C) (Oral)    Resp 18  Ht 5\' 7"  (1.702 m)    Wt 83.9 kg    SpO2 100%    BMI 28.98 kg/m   Physical Exam Vitals signs and nursing note reviewed.  Constitutional:      General: He is not in acute distress.    Appearance: He is well-developed.  HENT:     Head: Normocephalic and atraumatic.  Eyes:     General:        Right eye: No discharge.        Left eye: No discharge.     Conjunctiva/sclera: Conjunctivae normal.  Neck:     Vascular: No JVD.     Trachea: No tracheal deviation.  Cardiovascular:     Rate and Rhythm: Normal rate and regular rhythm.  Pulmonary:     Effort:  Pulmonary effort is normal.     Breath sounds: Normal breath sounds.  Abdominal:     General: Bowel sounds are decreased. There is no distension.     Palpations: Abdomen is soft.     Tenderness: There is abdominal tenderness in the right lower quadrant, suprapubic area and left lower quadrant. There is no right CVA tenderness, left CVA tenderness, guarding or rebound. Negative signs include Murphy's sign.  Skin:    Findings: No erythema.  Neurological:     Mental Status: He is alert.  Psychiatric:        Behavior: Behavior normal.      ED Treatments / Results  Labs (all labs ordered are listed, but only abnormal results are displayed) Labs Reviewed  COMPREHENSIVE METABOLIC PANEL - Abnormal; Notable for the following components:      Result Value   ALT 48 (*)    All other components within normal limits  LIPASE, BLOOD  CBC    EKG None  Radiology Ct Abdomen Pelvis W Contrast  Result Date: 05/01/2019 CLINICAL DATA:  Abdominal pain, diverticulitis suspected EXAM: CT ABDOMEN AND PELVIS WITH CONTRAST TECHNIQUE: Multidetector CT imaging of the abdomen and pelvis was performed using the standard protocol following bolus administration of intravenous contrast. CONTRAST:  100mL OMNIPAQUE IOHEXOL 300 MG/ML  SOLN COMPARISON:  CT abdomen pelvis 02/18/2014 FINDINGS: Lower chest: Bandlike areas of atelectasis and/or scarring in the lung bases. Normal heart size. No pericardial effusion. Hepatobiliary: Diffuse hepatic hypoattenuation compatible with hepatic steatosis. No focal liver abnormality is seen. No gallstones, gallbladder wall thickening, or biliary dilatation. Pancreas: Unremarkable. No pancreatic ductal dilatation or surrounding inflammatory changes. Spleen: Normal in size without focal abnormality. Adrenals/Urinary Tract: Adrenal glands are unremarkable. Kidneys are normal, without renal calculi, focal lesion, or hydronephrosis. Bladder is unremarkable. Stomach/Bowel: Distal esophagus,  stomach and duodenal sweep are unremarkable. No small bowel wall thickening or dilatation. No evidence of obstruction. A normal appendix is visualized. Focal pericolonic inflammation centered upon a culprit diverticulum in the left lower quadrant within the distal descending colon (5/69, 2/60). No organized collection or abscess is seen. No extraluminal gas. Remaining portions of the colon are unremarkable. Vascular/Lymphatic: The aorta is normal caliber. Reactive lower abdominal lymph nodes. Reproductive: The prostate and seminal vesicles are unremarkable. Other: No abdominopelvic free fluid or free gas. No bowel containing hernias. Musculoskeletal: No acute osseous abnormality or suspicious osseous lesion. IMPRESSION: 1. Uncomplicated distal descending colonic diverticulitis. No evidence of perforation or abscess formation. 2. Hepatic steatosis. Electronically Signed   By: Kreg ShropshirePrice  DeHay M.D.   On: 05/01/2019 19:26    Procedures Procedures (including critical care time)  Medications Ordered in ED Medications  sodium chloride flush (NS)  0.9 % injection 3 mL (3 mLs Intravenous Given 05/01/19 1847)  morphine 4 MG/ML injection 4 mg (4 mg Intravenous Given 05/01/19 1847)  iohexol (OMNIPAQUE) 300 MG/ML solution 100 mL (100 mLs Intravenous Contrast Given 05/01/19 1901)  ciprofloxacin (CIPRO) tablet 500 mg (500 mg Oral Given 05/01/19 2013)  metroNIDAZOLE (FLAGYL) tablet 500 mg (500 mg Oral Given 05/01/19 2013)     Initial Impression / Assessment and Plan / ED Course  I have reviewed the triage vital signs and the nursing notes.  Pertinent labs & imaging results that were available during my care of the patient were reviewed by me and considered in my medical decision making (see chart for details).       Patient presenting for evaluation of lower abdominal pain, nausea, constipation.  Has a history of perforated diverticulum in 2015 and states this feels similar just less severe.  He was started on  Augmentin at urgent care 3 days ago with no improvement.  Lab work reviewed by me shows no leukocytosis, no anemia, no metabolic derangements, no renal insufficiency.  CT scan shows evidence of uncomplicated diverticulitis involving the distal descending colon with no evidence of perforation or abscess.  No evidence of acute surgical abdominal pathology.  The patient's pain was managed in the ED.  He is tolerating p.o. food and fluids on reevaluation.  Serial abdominal examinations remain benign.  We will start him on a course of Cipro and Flagyl, instructed him to discontinue his Augmentin.  Recommend follow-up with PCP or gastroenterology for reevaluation of symptoms.  Discussed strict ED return precautions. Pt verbalized understanding of and agreement with plan and is safe for discharge home at this time.   Final Clinical Impressions(s) / ED Diagnoses   Final diagnoses:  Acute diverticulitis    ED Discharge Orders         Ordered    metroNIDAZOLE (FLAGYL) 500 MG tablet  3 times daily     05/01/19 2130    ciprofloxacin (CIPRO) 500 MG tablet  Every 12 hours     05/01/19 2130    ondansetron (ZOFRAN) 4 MG tablet  Every 8 hours PRN     05/01/19 2130           Renita Papa, PA-C 05/02/19 1152    Valarie Merino, MD 05/05/19 1623

## 2019-05-01 NOTE — ED Notes (Signed)
Patient ambulated to CT and back to bed.

## 2019-05-01 NOTE — ED Triage Notes (Signed)
Patient c/o  LLQ abdominal pain x 3 days and reports a history of diverticulitis. Patient states he was seen by his doctor 2 days ago and was prescribed Amoxicillin and was told if no better to come to the ED. Patient states his last BM was 3 days ago and feels constipated. patient states he had a small BM today.

## 2019-05-01 NOTE — Discharge Instructions (Addendum)
1. Medications: Please take all of your antibiotics until finished!   Take your antibiotics with food.  Common side effects of antibiotics include nausea, vomiting, abdominal discomfort, and diarrhea. You may help offset some of this with probiotics which you can buy or get in yogurt. Do not eat  or take the probiotics until 2 hours after your antibiotic.    Alternate 600 mg of ibuprofen and [phone] mg of Tylenol every 3 hours as needed for pain. Do not exceed 4000 mg of Tylenol daily.  Take ibuprofen with food to avoid upset stomach.  Take Zofran as needed for nausea.  Wait around 20 minutes before eating or drinking after taking this medication.  2. Treatment: rest, drink plenty of fluids, advance diet slowly.  Start with water and broth then advance to bland foods that will not upset your stomach such as crackers, mashed potatoes, and peanut butter.  I have attached information on bland diet.  3. Follow Up: Please followup with your primary doctor or gastroenterologist in 3-7 days for discussion of your diagnoses and further evaluation after today's visit; if you do not have a primary care doctor use the resource guide provided to find one; Please return to the ER for persistent vomiting, high fevers or worsening symptoms

## 2019-12-10 ENCOUNTER — Emergency Department (HOSPITAL_BASED_OUTPATIENT_CLINIC_OR_DEPARTMENT_OTHER): Payer: Self-pay

## 2019-12-10 ENCOUNTER — Emergency Department (HOSPITAL_BASED_OUTPATIENT_CLINIC_OR_DEPARTMENT_OTHER)
Admission: EM | Admit: 2019-12-10 | Discharge: 2019-12-10 | Disposition: A | Discharge status: Home / Self Care | Payer: Self-pay | Attending: Emergency Medicine | Admitting: Emergency Medicine

## 2019-12-10 ENCOUNTER — Other Ambulatory Visit: Payer: Self-pay

## 2019-12-10 ENCOUNTER — Encounter (HOSPITAL_BASED_OUTPATIENT_CLINIC_OR_DEPARTMENT_OTHER): Payer: Self-pay | Admitting: Emergency Medicine

## 2019-12-10 DIAGNOSIS — R05 Cough: Secondary | ICD-10-CM | POA: Insufficient documentation

## 2019-12-10 DIAGNOSIS — G510 Bell's palsy: Secondary | ICD-10-CM | POA: Insufficient documentation

## 2019-12-10 HISTORY — DX: Diverticulosis of intestine, part unspecified, without perforation or abscess without bleeding: K57.90

## 2019-12-10 MED ORDER — HYPROMELLOSE (GONIOSCOPIC) 2.5 % OP SOLN: 1.0000 [drp] | OPHTHALMIC | 12 refills | Status: DC | PRN

## 2019-12-10 MED ORDER — VALACYCLOVIR HCL 1 G PO TABS: 1000.0000 mg | ORAL_TABLET | Freq: Three times a day (TID) | ORAL | 0 refills | Status: AC

## 2019-12-10 MED ORDER — PREDNISONE 20 MG PO TABS: 60.0000 mg | ORAL_TABLET | Freq: Every day | ORAL | 0 refills | Status: AC

## 2019-12-10 NOTE — Discharge Instructions (Addendum)
Use artificial tears as needed, make sure you are wearing protective eyeglasses, make sure you tape eye shut at night, follow-up with neurology.  Chest x-ray is normal.  No concern for pneumonia at this time.

## 2019-12-10 NOTE — ED Notes (Signed)
Pt has facial droop and eye lid decrease, BEFAST is negative, due to having strong bilateral grips, hand strength and leg strength both equal.

## 2019-12-10 NOTE — ED Notes (Signed)
Informed pt awaiting radiology results, comfort measures provided, PO fluids provided

## 2019-12-10 NOTE — ED Notes (Addendum)
Last Covid vaccination on July 3rd, 22 days latter pt states he developed tonsillitis, on the 9th developed a cough, dry, non-productive, unable to sleep. Friday states he has difficulty opening his mouth completely. Present to Urgent Care 2 days ago, rec Prednisone. Is concerned he has Bell Palsy

## 2019-12-10 NOTE — ED Provider Notes (Addendum)
[facility] EMERGENCY DEPARTMENT Provider Note   CSN: [id number] Arrival date & time: 12/10/19  0930     History Chief Complaint  Patient presents with  . Cough  . Neurologic Problem    Paul Rodgers is a 34 y.o. male.  Patient comes with right-sided facial weakness.  Started about 2 days ago.  He is concern for Bell's palsy after looking it up.  He has been on steroids for 2 days for cough and congestion.  Finished antibiotics were throat infection as well.  Recently had his coronavirus vaccine.  Denies any weakness of arms or legs.  The history is provided by the patient.  Illness Severity:  Mild Onset quality:  Gradual Timing:  Constant Progression:  Unchanged Chronicity:  New Associated symptoms: cough   Associated symptoms: no abdominal pain, no chest pain, no congestion, no ear pain, no fever, no headaches, no rash, no shortness of breath, no sore throat and no vomiting        Past Medical History:  Diagnosis Date  . Diverticulitis   . Diverticulosis     Patient Active Problem List   Diagnosis Date Noted  . Sigmoid diverticulitis 02/18/2014    Past Surgical History:  Procedure Laterality Date  . HERNIA REPAIR     At age of 71       Family History  Problem Relation Age of Onset  . Hypertension Father     Social History   Tobacco Use  . Smoking status: Never Smoker  . Smokeless tobacco: Never Used  Vaping Use  . Vaping Use: Never used  Substance Use Topics  . Alcohol use: Yes    Comment: occ  . Drug use: No    Home Medications Prior to Admission medications   Medication Sig Start Date End Date Taking? Authorizing Provider  amoxicillin-clavulanate (AUGMENTIN) 875-125 MG tablet Take 1 tablet by mouth 2 (two) times daily. 04/29/19   [provider]  benzonatate (TESSALON) 100 MG capsule Take 1 capsule (100 mg total) by mouth every 8 (eight) hours. Patient not taking: Reported on 05/01/2019 06/16/17   Eyvonne Mechanic, PA-C    hydroxypropyl methylcellulose / hypromellose (ISOPTO TEARS / GONIOVISC) 2.5 % ophthalmic solution Place 1 drop into the right eye as needed for dry eyes. 12/10/19   Latrisa Hellums, DO  Multiple Vitamins-Minerals (ZINC PO) Take 1 tablet by mouth daily.    [provider]  ondansetron (ZOFRAN) 4 MG tablet Take 1 tablet (4 mg total) by mouth every 8 (eight) hours as needed for nausea or vomiting. 05/01/19   Fawze, Mina A, PA-C  predniSONE (DELTASONE) 20 MG tablet Take 3 tablets (60 mg total) by mouth daily for 7 doses. 12/10/19 12/17/19  Jessee Mezera, DO  valACYclovir (VALTREX) 1000 MG tablet Take 1 tablet (1,000 mg total) by mouth 3 (three) times daily for 7 days. 12/10/19 12/17/19  Virgina Norfolk, DO    Allergies    Patient has no known allergies.  Review of Systems   Review of Systems  Constitutional: Negative for chills and fever.  HENT: Negative for congestion, ear pain and sore throat.   Eyes: Negative for pain and visual disturbance.  Respiratory: Positive for cough. Negative for shortness of breath.   Cardiovascular: Negative for chest pain and palpitations.  Gastrointestinal: Negative for abdominal pain and vomiting.  Genitourinary: Negative for dysuria and hematuria.  Musculoskeletal: Negative for arthralgias and back pain.  Skin: Negative for color change and rash.  Neurological: Positive for facial asymmetry.  Negative for dizziness, tremors, seizures, syncope, speech difficulty, weakness, light-headedness, numbness and headaches.  All other systems reviewed and are negative.   Physical Exam Updated Vital Signs  ED Triage Vitals  Enc Vitals Group     BP 12/10/19 0942 133/82     Pulse Rate 12/10/19 0942 98     Resp 12/10/19 0942 18     Temp 12/10/19 0942 98.9 F (37.2 C)     Temp Source 12/10/19 0942 Oral     SpO2 12/10/19 0942 98 %     Weight 12/10/19 0939 174 lb (78.9 kg)     Height 12/10/19 0939 5\' 7"  (1.702 m)     Head Circumference --      Peak Flow --       Pain Score 12/10/19 0939 5     Pain Loc --      Pain Edu? --      Excl. in GC? --     Physical Exam Vitals and nursing note reviewed.  Constitutional:      General: He is not in acute distress.    Appearance: He is well-developed. He is not ill-appearing.  HENT:     Head: Normocephalic and atraumatic.     Right Ear: Tympanic membrane normal.     Left Ear: Tympanic membrane normal.     Mouth/Throat:     Mouth: Mucous membranes are moist.     Pharynx: No oropharyngeal exudate or posterior oropharyngeal erythema.  Eyes:     Extraocular Movements: Extraocular movements intact.     Conjunctiva/sclera: Conjunctivae normal.     Pupils: Pupils are equal, round, and reactive to light.  Cardiovascular:     Rate and Rhythm: Normal rate and regular rhythm.     Pulses: Normal pulses.     Heart sounds: Normal heart sounds. No murmur heard.   Pulmonary:     Effort: Pulmonary effort is normal. No respiratory distress.     Breath sounds: Normal breath sounds.  Abdominal:     Palpations: Abdomen is soft.     Tenderness: There is no abdominal tenderness.  Musculoskeletal:     Cervical back: Normal range of motion and neck supple.  Skin:    General: Skin is warm and dry.  Neurological:     Mental Status: He is alert and oriented to person, place, and time.     Comments: Patient with right-sided facial weakness including forehead, incomplete closure of the right eye otherwise cranial nerves are intact, normal strength and sensation throughout otherwise     ED Results / Procedures / Treatments   Labs (all labs ordered are listed, but only abnormal results are displayed) Labs Reviewed - No data to display  EKG None  Radiology DG Chest Portable 1 View  Result Date: 12/10/2019 CLINICAL DATA:  Cough for 2 days. EXAM: PORTABLE CHEST 1 VIEW COMPARISON:  None. FINDINGS: Midline trachea. Normal heart size and mediastinal contours. No pleural effusion or pneumothorax. Clear lungs. IMPRESSION:  Normal chest. Electronically Signed   By: 12/12/2019 M.D.   On: 12/10/2019 10:50    Procedures Procedures (including critical care time)  Medications Ordered in ED Medications - No data to display  ED Course  I have reviewed the triage vital signs and the nursing notes.  Pertinent labs & imaging results that were available during my care of the patient were reviewed by me and considered in my medical decision making (see chart for details).    MDM Rules/Calculators/A&P  Paul Rodgers is a male with no significant medical history presents the ED with cough, right-sided facial weakness.  Normal vitals.  No fever.  Patient appears to have Bell's palsy on exam.  Incomplete closure of the right eye, right-sided facial droop including the forehead which is consistent with peripheral nerve palsy/Bell's palsy.  TMs are clear and intact.  Throat without any signs of infection.  Did recently have his second coronavirus shot.  Has some type of throat infection shortly afterwards as well and finished course of antibiotics.  Facial weakness started 2 days ago.  Was seen 2 days ago for cough as well and was started on steroids.  However will adjust steroids to include prednisone for 7 days and valacyclovir for 1 week as well.  Patient educated about eye care including artificial tears, protective eyewear, taping his eye shut at night.  Will get chest x-ray given chronic cough.  Unclear etiology of Bell's palsy.    Chest x-ray is normal.  Discharged in good condition.  Understands return precautions.  We will have him follow-up with neurology.  This chart was dictated using voice recognition software.  Despite best efforts to proofread,  errors can occur which can change the documentation meaning.    Final Clinical Impression(s) / ED Diagnoses Final diagnoses:  Bell's palsy    Rx / DC Orders ED Discharge Orders         Ordered    predniSONE (DELTASONE) 20 MG tablet   Daily     Discontinue  Reprint     12/10/19 1019    valACYclovir (VALTREX) 1000 MG tablet  3 times daily     Discontinue  Reprint     12/10/19 1019    hydroxypropyl methylcellulose / hypromellose (ISOPTO TEARS / GONIOVISC) 2.5 % ophthalmic solution  As needed     Discontinue  Reprint     12/10/19 1020           Sary Bogie, DO 12/10/19 1055    Tessi Eustache, DO 12/10/19 1056

## 2019-12-10 NOTE — ED Notes (Signed)
Pt provided eye patches and paper tape and explained the importance of utilizing artificial tears.

## 2019-12-10 NOTE — ED Triage Notes (Signed)
Facial numbness since Friday. Was seen at [facility] and started on prednisone. Cough x 10 days.

## 2019-12-10 NOTE — ED Notes (Signed)
DC instructions provided to pt. Eye patch provided also discussed keeping eye moist with artificial tears, also discussed wearing eye goggles for protection during this episode. Also discussed the three Rx by the EDP as well. Opportunity for questions provided.

## 2019-12-10 NOTE — ED Notes (Signed)
ED Provider at bedside. 

## 2021-07-04 ENCOUNTER — Telehealth: Payer: Medicaid Other | Admitting: Family Medicine

## 2021-07-04 DIAGNOSIS — K5732 Diverticulitis of large intestine without perforation or abscess without bleeding: Secondary | ICD-10-CM

## 2021-07-04 MED ORDER — METRONIDAZOLE 500 MG PO TABS: 500.0000 mg | ORAL_TABLET | Freq: Three times a day (TID) | ORAL | 0 refills | Status: AC

## 2021-07-04 MED ORDER — CIPROFLOXACIN HCL 500 MG PO TABS: 500.0000 mg | ORAL_TABLET | Freq: Two times a day (BID) | ORAL | 0 refills | Status: AC

## 2021-07-04 NOTE — Progress Notes (Signed)
Virtual Visit Consent   Paul Rodgers, you are scheduled for a virtual visit with a [facility] provider today.     Just as with appointments in the office, your consent must be obtained to participate.  Your consent will be active for this visit and any virtual visit you may have with one of our providers in the next 365 days.     If you have a MyChart account, a copy of this consent can be sent to you electronically.  All virtual visits are billed to your insurance company just like a traditional visit in the office.    As this is a virtual visit, video technology does not allow for your provider to perform a traditional examination.  This may limit your provider's ability to fully assess your condition.  If your provider identifies any concerns that need to be evaluated in person or the need to arrange testing (such as labs, EKG, etc.), we will make arrangements to do so.     Although advances in technology are sophisticated, we cannot ensure that it will always work on either your end or our end.  If the connection with a video visit is poor, the visit may have to be switched to a telephone visit.  With either a video or telephone visit, we are not always able to ensure that we have a secure connection.     I need to obtain your verbal consent now.   Are you willing to proceed with your visit today?    Paul Rodgers has provided verbal consent on 07/04/2021 for a virtual visit (video or telephone).   Freddy Finner, NP   Date: 07/04/2021 9:58 AM   Virtual Visit via Video Note   I, Freddy Finner, connected with  Paul Rodgers  ([id number], [birth year]) on 07/04/21 at  9:45 AM EST by a video-enabled telemedicine application and verified that I am speaking with the correct person using two identifiers.  Location: Patient: Virtual Visit Location Patient: Home Provider: Virtual Visit Location Provider: Home Office   I discussed the limitations of evaluation and management by  telemedicine and the availability of in person appointments. The patient expressed understanding and agreed to proceed.    History of Present Illness: Paul Rodgers is a 36 y.o. who identifies as a male who was assigned male at birth, and is being seen today for abdominal pain. Hx of diverticulitis presents for eval of acute onset of worsening lower left side abdominal pain for 2 days now. He has changed to a liquid bland diet as well in hopes to help this. Reports symptoms began with pain and now seem to have progressed with constipation setting in. Denies nausea, vomiting, urinary symptoms, melena, hematochezia, fevers, chest pain, or shortness of breath.  Reports he will have waves of sharp pain in the left lower quadrant of the abdomen which worsen with certain movements, laughter, cough. Has not had issues in several years since 2020. Would like to get treatment before it progresses since symptoms are increasing.  .  Problems:  Patient Active Problem List   Diagnosis Date Noted   Sigmoid diverticulitis 02/18/2014    Allergies: No Known Allergies Medications:  Current Outpatient Medications:    ciprofloxacin (CIPRO) 500 MG tablet, Take 1 tablet (500 mg total) by mouth 2 (two) times daily for 10 days., Disp: 20 tablet, Rfl: 0   metroNIDAZOLE (FLAGYL) 500 MG tablet, Take 1 tablet (500 mg total) by mouth 3 (three) times  daily for 10 days., Disp: 30 tablet, Rfl: 0   benzonatate (TESSALON) 100 MG capsule, Take 1 capsule (100 mg total) by mouth every 8 (eight) hours. (Patient not taking: Reported on 05/01/2019), Disp: 21 capsule, Rfl: 0   hydroxypropyl methylcellulose / hypromellose (ISOPTO TEARS / GONIOVISC) 2.5 % ophthalmic solution, Place 1 drop into the right eye as needed for dry eyes., Disp: 15 mL, Rfl: 12   Multiple Vitamins-Minerals (ZINC PO), Take 1 tablet by mouth daily., Disp: , Rfl:    ondansetron (ZOFRAN) 4 MG tablet, Take 1 tablet (4 mg total) by mouth every 8 (eight) hours as needed  for nausea or vomiting., Disp: 10 tablet, Rfl: 0  Observations/Objective: Patient is well-developed, well-nourished in no acute distress.  Resting comfortably  at home.  Head is normocephalic, atraumatic.  No labored breathing.  Speech is clear and coherent with logical content.  Patient is alert and oriented at baseline.  Limited Abdominal exam due to video visit  Assessment and Plan: 1. Sigmoid diverticulitis - ciprofloxacin (CIPRO) 500 MG tablet; Take 1 tablet (500 mg total) by mouth 2 (two) times daily for 10 days.  Dispense: 20 tablet; Refill: 0 - metroNIDAZOLE (FLAGYL) 500 MG tablet; Take 1 tablet (500 mg total) by mouth 3 (three) times daily for 10 days.  Dispense: 30 tablet; Refill: 0   In setting of medicatl Hx and mild symptoms that are progressing will provided treatment of diverticulitis with strict ED follow up if no improving.  He verbalized understanding of this. Needs GI follow up as well.    Reviewed side effects, risks and benefits of medication.    Patient acknowledged agreement and understanding of the plan.   I discussed the assessment and treatment plan with the patient. The patient was provided an opportunity to ask questions and all were answered. The patient agreed with the plan and demonstrated an understanding of the instructions.   The patient was advised to call back or seek an in-person evaluation if the symptoms worsen or if the condition fails to improve as anticipated.   The above assessment and management plan was discussed with the patient. The patient verbalized understanding of and has agreed to the management plan. Patient is aware to call the clinic if symptoms persist or worsen. Patient is aware when to return to the clinic for a follow-up visit. Patient educated on when it is appropriate to go to the emergency department.   Follow Up Instructions: I discussed the assessment and treatment plan with the patient. The patient was provided an  opportunity to ask questions and all were answered. The patient agreed with the plan and demonstrated an understanding of the instructions.  A copy of instructions were sent to the patient via MyChart unless otherwise noted below.     The patient was advised to call back or seek an in-person evaluation if the symptoms worsen or if the condition fails to improve as anticipated.  Time:  I spent 13 minutes with the patient via telehealth technology discussing the above problems/concerns.    Freddy Finner, NP

## 2021-07-04 NOTE — Patient Instructions (Addendum)
Please be seen in ED if symptoms fail to improve or worsen in next 1-2 days.   Diverticulitis Diverticulitis is when small pouches in your colon (large intestine) get infected or swollen. This causes pain in the belly (abdomen) and watery poop (diarrhea). These pouches are called diverticula. The pouches form in people who have a condition called diverticulosis. What are the causes? This condition may be caused by poop (stool) that gets trapped in the pouches in your colon. The poop lets germs (bacteria) grow in the pouches. This causes the infection. What increases the risk? You are more likely to get this condition if you have small pouches in your colon. The risk is higher if: You are overweight or very overweight (obese). You do not exercise enough. You drink alcohol. You smoke or use products with tobacco in them. You eat a diet that has a lot of red meat such as beef, pork, or lamb. You eat a diet that does not have enough fiber in it. You are older than 38 years of age. What are the signs or symptoms? Pain in the belly. Pain is often on the left side, but it may be in other areas. Fever and feeling cold. Feeling like you may vomit. Vomiting. Having cramps. Feeling full. Changes to how often you poop. Blood in your poop. How is this treated? Most cases are treated at home by: Taking over-the-counter pain medicines. Following a clear liquid diet. Taking antibiotic medicines. Resting. Very bad cases may need to be treated at a hospital. This may include: Not eating or drinking. Taking prescription pain medicine. Getting antibiotic medicines through an IV tube. Getting fluid and food through an IV tube. Having surgery. When you are feeling better, your doctor may tell you to have a test to check your colon (colonoscopy). Follow these instructions at home: Medicines Take over-the-counter and prescription medicines only as told by your doctor. These  include: Antibiotics. Pain medicines. Fiber pills. Probiotics. Stool softeners. If you were prescribed an antibiotic medicine, take it as told by your doctor. Do not stop taking the antibiotic even if you start to feel better. Ask your doctor if the medicine prescribed to you requires you to avoid driving or using machinery. Eating and drinking  Follow a diet as told by your doctor. When you feel better, your doctor may tell you to change your diet. You may need to eat a lot of fiber. Fiber makes it easier to poop (have a bowel movement). Foods with fiber include: Berries. Beans. Lentils. Green vegetables. Avoid eating red meat. General instructions Do not use any products that contain nicotine or tobacco, such as cigarettes, e-cigarettes, and chewing tobacco. If you need help quitting, ask your doctor. Exercise 3 or more times a week. Try to get 30 minutes each time. Exercise enough to sweat and make your heart beat faster. Keep all follow-up visits as told by your doctor. This is important. Contact a doctor if: Your pain does not get better. You are not pooping like normal. Get help right away if: Your pain gets worse. Your symptoms do not get better. Your symptoms get worse very fast. You have a fever. You vomit more than one time. You have poop that is: Bloody. Black. Tarry. Summary This condition happens when small pouches in your colon get infected or swollen. Take medicines only as told by your doctor. Follow a diet as told by your doctor. Keep all follow-up visits as told by your doctor. This is important. This  information is not intended to replace advice given to you by your health care provider. Make sure you discuss any questions you have with your health care provider. Document Revised: 02/20/2019 Document Reviewed: 02/20/2019 Elsevier Patient Education  2022 [employer].

## 2021-07-08 ENCOUNTER — Encounter: Payer: Self-pay | Admitting: Family Medicine

## 2021-08-14 ENCOUNTER — Emergency Department (HOSPITAL_BASED_OUTPATIENT_CLINIC_OR_DEPARTMENT_OTHER): Payer: Self-pay

## 2021-08-14 ENCOUNTER — Encounter (HOSPITAL_BASED_OUTPATIENT_CLINIC_OR_DEPARTMENT_OTHER): Payer: Self-pay | Admitting: Emergency Medicine

## 2021-08-14 ENCOUNTER — Other Ambulatory Visit: Payer: Self-pay

## 2021-08-14 ENCOUNTER — Emergency Department (HOSPITAL_BASED_OUTPATIENT_CLINIC_OR_DEPARTMENT_OTHER)
Admission: EM | Admit: 2021-08-14 | Discharge: 2021-08-14 | Disposition: A | Discharge status: Home / Self Care | Payer: Self-pay | Attending: Emergency Medicine | Admitting: Emergency Medicine

## 2021-08-14 DIAGNOSIS — K5732 Diverticulitis of large intestine without perforation or abscess without bleeding: Secondary | ICD-10-CM | POA: Insufficient documentation

## 2021-08-14 DIAGNOSIS — K5792 Diverticulitis of intestine, part unspecified, without perforation or abscess without bleeding: Secondary | ICD-10-CM

## 2021-08-14 LAB — CBC WITH DIFFERENTIAL/PLATELET
Abs Immature Granulocytes: 0.01 10*3/uL (ref 0.00–0.07)
Basophils Absolute: 0 10*3/uL (ref 0.0–0.1)
Basophils Relative: 0 %
Eosinophils Absolute: 0 10*3/uL (ref 0.0–0.5)
Eosinophils Relative: 1 %
HCT: 41.5 % (ref 39.0–52.0)
Hemoglobin: 14.8 g/dL (ref 13.0–17.0)
Immature Granulocytes: 0 %
Lymphocytes Relative: 22 %
Lymphs Abs: 1.2 10*3/uL (ref 0.7–4.0)
MCH: 29.5 pg (ref 26.0–34.0)
MCHC: 35.7 g/dL (ref 30.0–36.0)
MCV: 82.8 fL (ref 80.0–100.0)
Monocytes Absolute: 0.6 10*3/uL (ref 0.1–1.0)
Monocytes Relative: 10 %
Neutro Abs: 3.7 10*3/uL (ref 1.7–7.7)
Neutrophils Relative %: 67 %
Platelets: 200 10*3/uL (ref 150–400)
RBC: 5.01 MIL/uL (ref 4.22–5.81)
RDW: 12.4 % (ref 11.5–15.5)
WBC: 5.6 10*3/uL (ref 4.0–10.5)
nRBC: 0 % (ref 0.0–0.2)

## 2021-08-14 LAB — URINALYSIS, ROUTINE W REFLEX MICROSCOPIC
Bilirubin Urine: NEGATIVE
Glucose, UA: NEGATIVE mg/dL
Hgb urine dipstick: NEGATIVE
Ketones, ur: NEGATIVE mg/dL
Leukocytes,Ua: NEGATIVE
Nitrite: NEGATIVE
Protein, ur: NEGATIVE mg/dL
Specific Gravity, Urine: <=1.005 (ref 1.005–1.030)
pH: 5.5 (ref 5.0–8.0)

## 2021-08-14 LAB — COMPREHENSIVE METABOLIC PANEL
ALT: 34 U/L (ref 0–44)
AST: 24 U/L (ref 15–41)
Albumin: 4.6 g/dL (ref 3.5–5.0)
Alkaline Phosphatase: 97 U/L (ref 38–126)
Anion gap: 8 (ref 5–15)
BUN: 15 mg/dL (ref 6–20)
CO2: 26 mmol/L (ref 22–32)
Calcium: 9.2 mg/dL (ref 8.9–10.3)
Chloride: 101 mmol/L (ref 98–111)
Creatinine, Ser: 0.92 mg/dL (ref 0.61–1.24)
GFR, Estimated: >60 mL/min (ref >60)
Glucose, Bld: 94 mg/dL (ref 70–99)
Potassium: 3.9 mmol/L (ref 3.5–5.1)
Sodium: 135 mmol/L (ref 135–145)
Total Bilirubin: 1.5 mg/dL — ABNORMAL HIGH (ref 0.3–1.2)
Total Protein: 7.4 g/dL (ref 6.5–8.1)

## 2021-08-14 LAB — LIPASE, BLOOD: Lipase: 37 U/L (ref 11–51)

## 2021-08-14 MED ORDER — MORPHINE SULFATE (PF) 4 MG/ML IV SOLN
4.0000 mg | Freq: Once | INTRAVENOUS | Status: AC
  Administered 2021-08-14: 4 mg via INTRAVENOUS
  Filled 2021-08-14: qty 1

## 2021-08-14 MED ORDER — IOHEXOL 300 MG/ML  SOLN
100.0000 mL | Freq: Once | INTRAMUSCULAR | Status: AC | PRN
  Administered 2021-08-14: 100 mL via INTRAVENOUS

## 2021-08-14 MED ORDER — ONDANSETRON HCL 4 MG/2ML IJ SOLN
4.0000 mg | Freq: Once | INTRAMUSCULAR | Status: AC
  Administered 2021-08-14: 4 mg via INTRAVENOUS
  Filled 2021-08-14: qty 2

## 2021-08-14 MED ORDER — SODIUM CHLORIDE 0.9 % IV BOLUS
1000.0000 mL | Freq: Once | INTRAVENOUS | Status: AC
  Administered 2021-08-14: 1000 mL via INTRAVENOUS

## 2021-08-14 MED ORDER — AMOXICILLIN-POT CLAVULANATE 875-125 MG PO TABS: 1.0000 | ORAL_TABLET | Freq: Two times a day (BID) | ORAL | 0 refills | Status: AC

## 2021-08-14 MED ORDER — ONDANSETRON HCL 4 MG PO TABS: 4.0000 mg | ORAL_TABLET | Freq: Four times a day (QID) | ORAL | 0 refills | Status: DC | PRN

## 2021-08-14 NOTE — Discharge Instructions (Addendum)
It was a pleasure caring for you today in the emergency department. ° °Please return to the emergency department for any worsening or worrisome symptoms. ° ° °

## 2021-08-14 NOTE — ED Provider Notes (Signed)
[facility] EMERGENCY DEPARTMENT Provider Note   CSN: [id number] Arrival date & time: 08/14/21  0841     History  Chief Complaint  Patient presents with   Abdominal Pain    Paul Rodgers is a 36 y.o. male.  This is a 36 y.o. male with significant medical history as below, including diverticulitis, Bell's palsy who presents to the ED with complaint of left lower quad abdominal pain.  Onset of pain yesterday afternoon, typical of prior diverticulitis episodes last bowel movement was WNL.  No blood per rectum.  Last episode of diverticulitis was about a month ago.  Completed antibiotics.  Was feeling well until yesterday.  No nausea or vomiting.  No fevers or chills.  No change in bowel or bladder function.  No recent diet changes.  No Medications prior to arrival.  Does not follow with gastroenterology    Past Medical History: No date: Diverticulitis No date: Diverticulosis  Past Surgical History: No date: HERNIA REPAIR     Comment:  At age of 1    The history is provided by the patient. No language interpreter was used.      Home Medications Prior to Admission medications   Medication Sig Start Date End Date Taking? Authorizing Provider  amoxicillin-clavulanate (AUGMENTIN) 875-125 MG tablet Take 1 tablet by mouth every 12 (twelve) hours for 10 days. 08/14/21 08/24/21 Yes Tanda Rockers A, DO  ondansetron (ZOFRAN) 4 MG tablet Take 1 tablet (4 mg total) by mouth every 6 (six) hours as needed for nausea or vomiting. 08/14/21  Yes Tanda Rockers A, DO  benzonatate (TESSALON) 100 MG capsule Take 1 capsule (100 mg total) by mouth every 8 (eight) hours. Patient not taking: Reported on 05/01/2019 06/16/17   Eyvonne Mechanic, PA-C  hydroxypropyl methylcellulose / hypromellose (ISOPTO TEARS / GONIOVISC) 2.5 % ophthalmic solution Place 1 drop into the right eye as needed for dry eyes. 12/10/19   Curatolo, Adam, DO  Multiple Vitamins-Minerals (ZINC PO) Take 1 tablet by mouth daily.     [provider]  ondansetron (ZOFRAN) 4 MG tablet Take 1 tablet (4 mg total) by mouth every 8 (eight) hours as needed for nausea or vomiting. 05/01/19   Michela Pitcher A, PA-C      Allergies    Patient has no known allergies.    Review of Systems   Review of Systems  Constitutional:  Negative for chills and fever.  HENT:  Negative for facial swelling and trouble swallowing.   Eyes:  Negative for photophobia and visual disturbance.  Respiratory:  Negative for cough and shortness of breath.   Cardiovascular:  Negative for chest pain and palpitations.  Gastrointestinal:  Positive for abdominal distention and abdominal pain. Negative for nausea and vomiting.  Endocrine: Negative for polydipsia and polyuria.  Genitourinary:  Negative for difficulty urinating and hematuria.  Musculoskeletal:  Negative for gait problem and joint swelling.  Skin:  Negative for pallor and rash.  Neurological:  Negative for syncope and headaches.  Psychiatric/Behavioral:  Negative for agitation and confusion.    Physical Exam Updated Vital Signs BP 113/80   Pulse 65   Temp 97.9 F (36.6 C) (Oral)   Resp 17   Ht 5\' 6"  (1.676 m)   Wt 79.4 kg   SpO2 100%   BMI 28.25 kg/m  Physical Exam Vitals and nursing note reviewed.  Constitutional:      General: He is not in acute distress.    Appearance: He is well-developed.  HENT:  Head: Normocephalic and atraumatic.     Right Ear: External ear normal.     Left Ear: External ear normal.     Mouth/Throat:     Mouth: Mucous membranes are moist.  Eyes:     General: No scleral icterus. Cardiovascular:     Rate and Rhythm: Normal rate and regular rhythm.     Pulses: Normal pulses.     Heart sounds: Normal heart sounds.  Pulmonary:     Effort: Pulmonary effort is normal. No respiratory distress.     Breath sounds: Normal breath sounds.  Abdominal:     General: Abdomen is flat.     Palpations: Abdomen is soft.     Tenderness: There is abdominal  tenderness in the left lower quadrant.  Musculoskeletal:        General: Normal range of motion.     Cervical back: Normal range of motion.     Right lower leg: No edema.     Left lower leg: No edema.  Skin:    General: Skin is warm and dry.     Capillary Refill: Capillary refill takes less than 2 seconds.  Neurological:     Mental Status: He is alert and oriented to person, place, and time.  Psychiatric:        Mood and Affect: Mood normal.        Behavior: Behavior normal.    ED Results / Procedures / Treatments   Labs (all labs ordered are listed, but only abnormal results are displayed) Labs Reviewed  COMPREHENSIVE METABOLIC PANEL - Abnormal; Notable for the following components:      Result Value   Total Bilirubin 1.5 (*)    All other components within normal limits  CBC WITH DIFFERENTIAL/PLATELET  LIPASE, BLOOD  URINALYSIS, ROUTINE W REFLEX MICROSCOPIC    EKG None  Radiology CT ABDOMEN PELVIS W CONTRAST  Result Date: 08/14/2021 CLINICAL DATA:  Left lower quadrant abdominal pain beginning yesterday. EXAM: CT ABDOMEN AND PELVIS WITH CONTRAST TECHNIQUE: Multidetector CT imaging of the abdomen and pelvis was performed using the standard protocol following bolus administration of intravenous contrast. RADIATION DOSE REDUCTION: This exam was performed according to the departmental dose-optimization program which includes automated exposure control, adjustment of the mA and/or kV according to patient size and/or use of iterative reconstruction technique. CONTRAST:  OMNIPAQUE IOHEXOL 300 MG/ML  SOLN COMPARISON:  05/01/2019 FINDINGS: Lower chest: Normal Hepatobiliary: Diffuse fatty change of the liver. 6 mm cyst in the ventral right lobe as seen previously. No calcified gallstones. Pancreas: Normal Spleen: Normal Adrenals/Urinary Tract: Adrenal glands are normal. Kidneys are normal. No cyst, mass, stone or hydronephrosis. Bladder is normal. Stomach/Bowel: Stomach and small  intestine are normal. Normal appendix. There is diverticulosis of the left colon with acute diverticulitis in the same location as was seen in 2020 at the distal descending colon. Surrounding inflammatory change of the fat without evidence of abscess or free air. Vascular/Lymphatic: Normal Reproductive: Normal Other: None Musculoskeletal: Normal IMPRESSION: Similar appearance to the study of December 2020. Acute diverticulitis of the distal descending colon. Surrounding inflammatory change in the fat but without evidence of abscess or free air. Diffuse fatty change of the liver, similar in degree. Electronically Signed   By: Paulina Fusi M.D.   On: 08/14/2021 10:27    Procedures Procedures    Medications Ordered in ED Medications  sodium chloride 0.9 % bolus 1,000 mL (0 mLs Intravenous Stopped 08/14/21 1041)  ondansetron (ZOFRAN) injection 4 mg (4 mg  Intravenous Given 08/14/21 0921)  morphine (PF) 4 MG/ML injection 4 mg (4 mg Intravenous Given 08/14/21 0921)  iohexol (OMNIPAQUE) 300 MG/ML solution 100 mL (100 mLs Intravenous Contrast Given 08/14/21 1012)    ED Course/ Medical Decision Making/ A&P                           Medical Decision Making Amount and/or Complexity of Data Reviewed Labs: ordered. Radiology: ordered.  Risk Prescription drug management.   Initial Impression and Ddx This patient presents to the Emergency Department for the above complaint. This involves an extensive number of treatment options and is a complaint that carries with it a high risk of complications and morbidity. Vital signs were reviewed.   Serious etiologies considered. Ddx includes but is not limited to: Diverticulitis, diverticulosis, viral, metabolic, MSK recurrent diverticulitis  Patient PMH that increases complexity of ED encounter:  none  Social determinants of health include - n/a   Previous records obtained and reviewed    Interpretation of Diagnostics Labs & imaging results that were  available during my care of the patient were visualized by me and considered in my medical decision making.   I ordered imaging studies which included CT a/p and I visualized the imaging and I agree with radiologist interpretation. Diverticulitis noted  Cardiac monitoring reviewed and interpreted personally which shows NSR     Patient Reassessment and Ultimate Disposition/Management          Patient with history of recurrent diverticulitis.  Found to have uncomplicated diverticulitis today.  Abdomen soft, not peritoneal.  HDS.  Tolerate p.o. intake.  Labs are stable.  It is reasonable to trial outpatient antibiotics, discussed clear liquid diet.  Will give antiemetics for home.  Recommend Tylenol or Motrin as needed for discomfort at home         The patient improved significantly and was discharged in stable condition. Detailed discussions were had with the patient regarding current findings, and need for close f/u with PCP or on call doctor. The patient has been instructed to return immediately if the symptoms worsen in any way for re-evaluation. Patient verbalized understanding and is in agreement with current care plan. All questions answered prior to discharge.           Complexity of Problems Addressed Acute illness or injury that poses threat of life of bodily function  Additional Data Reviewed and Analyzed Further history obtained from: Past medical history and medications listed in the EMR, Prior ED visit notes, and Prior labs/imaging results  Patient Encounter Risk Assessment Prescriptions and Consideration of hospitalization      This chart was dictated using voice recognition software.  Despite best efforts to proofread,  errors can occur which can change the documentation meaning.         Final Clinical Impression(s) / ED Diagnoses Final diagnoses:  Diverticulitis    Rx / DC Orders ED Discharge Orders          Ordered     amoxicillin-clavulanate (AUGMENTIN) 875-125 MG tablet  Every 12 hours        08/14/21 1327    ondansetron (ZOFRAN) 4 MG tablet  Every 6 hours PRN        08/14/21 1327              Sloan Leiter, DO 08/14/21 1332

## 2021-08-14 NOTE — ED Triage Notes (Signed)
Recurrent left lower quadrant pain , Hx diverticulitis, last flare up 1 month ago . Denies NVD. Last BM last night .   ?

## 2021-11-06 ENCOUNTER — Other Ambulatory Visit: Payer: Self-pay

## 2021-11-06 ENCOUNTER — Emergency Department (HOSPITAL_BASED_OUTPATIENT_CLINIC_OR_DEPARTMENT_OTHER)
Admission: EM | Admit: 2021-11-06 | Discharge: 2021-11-06 | Disposition: A | Discharge status: Home / Self Care | Payer: Medicaid Other | Attending: Emergency Medicine | Admitting: Emergency Medicine

## 2021-11-06 ENCOUNTER — Encounter (HOSPITAL_BASED_OUTPATIENT_CLINIC_OR_DEPARTMENT_OTHER): Payer: Self-pay

## 2021-11-06 DIAGNOSIS — K5732 Diverticulitis of large intestine without perforation or abscess without bleeding: Secondary | ICD-10-CM | POA: Insufficient documentation

## 2021-11-06 DIAGNOSIS — K5792 Diverticulitis of intestine, part unspecified, without perforation or abscess without bleeding: Secondary | ICD-10-CM

## 2021-11-06 LAB — CBC
HCT: 42.3 % (ref 39.0–52.0)
Hemoglobin: 15 g/dL (ref 13.0–17.0)
MCH: 29.9 pg (ref 26.0–34.0)
MCHC: 35.5 g/dL (ref 30.0–36.0)
MCV: 84.3 fL (ref 80.0–100.0)
Platelets: 185 10*3/uL (ref 150–400)
RBC: 5.02 MIL/uL (ref 4.22–5.81)
RDW: 12.4 % (ref 11.5–15.5)
WBC: 10.8 10*3/uL — ABNORMAL HIGH (ref 4.0–10.5)
nRBC: 0 % (ref 0.0–0.2)

## 2021-11-06 LAB — COMPREHENSIVE METABOLIC PANEL
ALT: 34 U/L (ref 0–44)
AST: 20 U/L (ref 15–41)
Albumin: 4.5 g/dL (ref 3.5–5.0)
Alkaline Phosphatase: 90 U/L (ref 38–126)
Anion gap: 8 (ref 5–15)
BUN: 17 mg/dL (ref 6–20)
CO2: 27 mmol/L (ref 22–32)
Calcium: 9.2 mg/dL (ref 8.9–10.3)
Chloride: 102 mmol/L (ref 98–111)
Creatinine, Ser: 1.07 mg/dL (ref 0.61–1.24)
GFR, Estimated: >60 mL/min (ref >60)
Glucose, Bld: 97 mg/dL (ref 70–99)
Potassium: 4.1 mmol/L (ref 3.5–5.1)
Sodium: 137 mmol/L (ref 135–145)
Total Bilirubin: 1.5 mg/dL — ABNORMAL HIGH (ref 0.3–1.2)
Total Protein: 7.6 g/dL (ref 6.5–8.1)

## 2021-11-06 LAB — URINALYSIS, ROUTINE W REFLEX MICROSCOPIC
Bilirubin Urine: NEGATIVE
Glucose, UA: NEGATIVE mg/dL
Hgb urine dipstick: NEGATIVE
Ketones, ur: NEGATIVE mg/dL
Leukocytes,Ua: NEGATIVE
Nitrite: NEGATIVE
Protein, ur: NEGATIVE mg/dL
Specific Gravity, Urine: 1.015 (ref 1.005–1.030)
pH: 6 (ref 5.0–8.0)

## 2021-11-06 LAB — LIPASE, BLOOD: Lipase: 33 U/L (ref 11–51)

## 2021-11-06 MED ORDER — AMOXICILLIN-POT CLAVULANATE 875-125 MG PO TABS
1.0000 | ORAL_TABLET | Freq: Once | ORAL | Status: AC
  Administered 2021-11-06: 1 via ORAL
  Filled 2021-11-06: qty 1

## 2021-11-06 MED ORDER — AMOXICILLIN-POT CLAVULANATE 875-125 MG PO TABS: 1.0000 | ORAL_TABLET | Freq: Two times a day (BID) | ORAL | 0 refills | Status: DC

## 2021-11-06 NOTE — ED Triage Notes (Signed)
C/o lower abdominal pain since yesterday. Denies N/V/D or urinary symptoms.

## 2021-11-06 NOTE — ED Provider Notes (Signed)
[facility] EMERGENCY DEPARTMENT Provider Note   CSN: [id number] Arrival date & time: 11/06/21  1033     History  Chief Complaint  Patient presents with   Abdominal Pain    Paul Rodgers is a 36 y.o. male.  HPI     36 year old male with history of diverticulitis comes in with chief complaint of lower quadrant abdominal pain.  Pain started yesterday, it was initially mild, but as the day went on it became more noticeable.  Pain is still coming in waves.  He has had history of diverticulitis.  Pain typically is left-sided with diverticulitis, this pain felt more central, but still in the lower quadrant.  She denies any associated burning with urination, blood in the urine.  There is no history of kidney stone.  Review of system is negative for any fevers, chills, nausea, vomiting, diarrhea.  No abdominal surgical history  Home Medications Prior to Admission medications   Medication Sig Start Date End Date Taking? Authorizing Provider  amoxicillin-clavulanate (AUGMENTIN) 875-125 MG tablet Take 1 tablet by mouth every 12 (twelve) hours. 11/06/21  Yes Derwood Kaplan, MD  benzonatate (TESSALON) 100 MG capsule Take 1 capsule (100 mg total) by mouth every 8 (eight) hours. Patient not taking: Reported on 05/01/2019 06/16/17   Eyvonne Mechanic, PA-C  hydroxypropyl methylcellulose / hypromellose (ISOPTO TEARS / GONIOVISC) 2.5 % ophthalmic solution Place 1 drop into the right eye as needed for dry eyes. 12/10/19   Curatolo, Adam, DO  Multiple Vitamins-Minerals (ZINC PO) Take 1 tablet by mouth daily.    [provider]  ondansetron (ZOFRAN) 4 MG tablet Take 1 tablet (4 mg total) by mouth every 8 (eight) hours as needed for nausea or vomiting. 05/01/19   Luevenia Maxin, Mina A, PA-C  ondansetron (ZOFRAN) 4 MG tablet Take 1 tablet (4 mg total) by mouth every 6 (six) hours as needed for nausea or vomiting. 08/14/21   Sloan Leiter, DO      Allergies    Patient has no known allergies.     Review of Systems   Review of Systems  All other systems reviewed and are negative.   Physical Exam Updated Vital Signs BP 116/78 (BP Location: Right Arm)   Pulse 87   Temp 98 F (36.7 C) (Oral)   Resp 18   Ht 5\' 6"  (1.676 m)   Wt 79.4 kg   SpO2 100%   BMI 28.25 kg/m  Physical Exam Vitals and nursing note reviewed.  Constitutional:      Appearance: He is well-developed.  HENT:     Head: Atraumatic.  Cardiovascular:     Rate and Rhythm: Normal rate.  Pulmonary:     Effort: Pulmonary effort is normal.  Abdominal:     Tenderness: There is abdominal tenderness in the suprapubic area and left lower quadrant. There is no guarding or rebound.  Musculoskeletal:     Cervical back: Neck supple.  Skin:    General: Skin is warm.  Neurological:     Mental Status: He is alert and oriented to person, place, and time.     ED Results / Procedures / Treatments   Labs (all labs ordered are listed, but only abnormal results are displayed) Labs Reviewed  COMPREHENSIVE METABOLIC PANEL - Abnormal; Notable for the following components:      Result Value   Total Bilirubin 1.5 (*)    All other components within normal limits  CBC - Abnormal; Notable for the following components:   WBC  10.8 (*)    All other components within normal limits  URINALYSIS, ROUTINE W REFLEX MICROSCOPIC  LIPASE, BLOOD    EKG None  Radiology No results found.  Procedures Procedures    Medications Ordered in ED Medications  amoxicillin-clavulanate (AUGMENTIN) 875-125 MG per tablet 1 tablet (1 tablet Oral Given 11/06/21 1434)    ED Course/ Medical Decision Making/ A&P                           Medical Decision Making Amount and/or Complexity of Data Reviewed Labs: ordered.  Risk Prescription drug management.   This patient presents to the ED with chief complaint(s) of abdominal pain with pertinent past medical history of diverticulitis.  Patient does not have any peritoneal  findings.  The differential diagnosis includes acute diverticulitis without any complication such as perforation, abscess, cystitis, pyelonephritis, kidney stone, colitis.  The initial plan is to get basic blood work and reassess the patient.   Additional history obtained: Records reviewed : previous CT abdomen and pelvis.  Patient had a CT abdomen pelvis in March which was positive for diverticulitis without complication.  He had CT scan 2 more times in our system, both positive for diverticulitis as well  Independent labs interpretation:  The following labs were independently interpreted: CBC, BMP, urine analysis which look normal and are reassuring.  No hematuria, no significant leukocytosis.   Treatment and Reassessment: Patient's lab results are reassuring.  Urine analysis is normal.  Patient did not have any peritoneal findings.  Discussed with him, that most likely this is diverticulitis without complication.  Discussed with him the risk and benefit of getting CT scan in his situation, and we both agreed that benefit does not outweigh the risk.  We will treat this with Augmentin like his previous diverticulitis. Return precautions discussed.  Pain at this moment is 2 out of 10, patient does not want any pain meds.  Considered advanced imaging like CT scan, but we will proceed with clinical diagnosis of diverticulitis instead.  Final Clinical Impression(s) / ED Diagnoses Final diagnoses:  Acute diverticulitis    Rx / DC Orders ED Discharge Orders          Ordered    amoxicillin-clavulanate (AUGMENTIN) 875-125 MG tablet  Every 12 hours        11/06/21 1434              Derwood Kaplan, MD 11/06/21 1442

## 2021-12-12 ENCOUNTER — Telehealth: Payer: Self-pay | Admitting: Physician Assistant

## 2021-12-12 ENCOUNTER — Encounter: Payer: Self-pay | Admitting: Physician Assistant

## 2021-12-12 DIAGNOSIS — K5732 Diverticulitis of large intestine without perforation or abscess without bleeding: Secondary | ICD-10-CM

## 2021-12-12 MED ORDER — AMOXICILLIN-POT CLAVULANATE 875-125 MG PO TABS: 1.0000 | ORAL_TABLET | Freq: Two times a day (BID) | ORAL | 0 refills | Status: DC

## 2021-12-12 NOTE — Progress Notes (Signed)
Virtual Visit Consent   Paul Rodgers, you are scheduled for a virtual visit with a [facility] provider today. Just as with appointments in the office, your consent must be obtained to participate. Your consent will be active for this visit and any virtual visit you may have with one of our providers in the next 365 days. If you have a MyChart account, a copy of this consent can be sent to you electronically.  As this is a virtual visit, video technology does not allow for your provider to perform a traditional examination. This may limit your provider's ability to fully assess your condition. If your provider identifies any concerns that need to be evaluated in person or the need to arrange testing (such as labs, EKG, etc.), we will make arrangements to do so. Although advances in technology are sophisticated, we cannot ensure that it will always work on either your end or our end. If the connection with a video visit is poor, the visit may have to be switched to a telephone visit. With either a video or telephone visit, we are not always able to ensure that we have a secure connection.  By engaging in this virtual visit, you consent to the provision of healthcare and authorize for your insurance to be billed (if applicable) for the services provided during this visit. Depending on your insurance coverage, you may receive a charge related to this service.  I need to obtain your verbal consent now. Are you willing to proceed with your visit today? FERLANDO LIA has provided verbal consent on 12/12/2021 for a virtual visit (video or telephone). Margaretann Loveless, PA-C  Date: 12/12/2021 8:11 AM  Virtual Visit via Video Note   I, Margaretann Loveless, connected with  Paul Rodgers  ([id number], 1986/01/26) on 12/12/21 at  8:00 AM EDT by a video-enabled telemedicine application and verified that I am speaking with the correct person using two identifiers.  Location: Patient: Virtual Visit Location  Patient: Home Provider: Virtual Visit Location Provider: Home Office   I discussed the limitations of evaluation and management by telemedicine and the availability of in person appointments. The patient expressed understanding and agreed to proceed.    History of Present Illness: Paul Rodgers is a 36 y.o. who identifies as a male who was assigned male at birth, and is being seen today for recurrent diverticulitis.  HPI: Abdominal Pain This is a recurrent problem. The current episode started yesterday. The onset quality is sudden. The problem occurs constantly. The problem has been gradually worsening. The pain is located in the LLQ. The pain is at a severity of 3/10. The pain is mild. The quality of the pain is colicky and dull. The abdominal pain does not radiate. Pertinent negatives include no arthralgias, belching, constipation, fever, flatus, hematochezia, melena, myalgias or nausea. Nothing aggravates the pain. The pain is relieved by Nothing. Treatments tried: ibuprofen. The treatment provided no relief. Prior diagnostic workup includes CT scan. diverticulitis      Problems:  Patient Active Problem List   Diagnosis Date Noted   Sigmoid diverticulitis 02/18/2014    Allergies: No Known Allergies Medications:  Current Outpatient Medications:    amoxicillin-clavulanate (AUGMENTIN) 875-125 MG tablet, Take 1 tablet by mouth 2 (two) times daily., Disp: 14 tablet, Rfl: 0   hydroxypropyl methylcellulose / hypromellose (ISOPTO TEARS / GONIOVISC) 2.5 % ophthalmic solution, Place 1 drop into the right eye as needed for dry eyes., Disp: 15 mL, Rfl: 12  Multiple Vitamins-Minerals (ZINC PO), Take 1 tablet by mouth daily., Disp: , Rfl:    ondansetron (ZOFRAN) 4 MG tablet, Take 1 tablet (4 mg total) by mouth every 8 (eight) hours as needed for nausea or vomiting., Disp: 10 tablet, Rfl: 0   ondansetron (ZOFRAN) 4 MG tablet, Take 1 tablet (4 mg total) by mouth every 6 (six) hours as needed for  nausea or vomiting., Disp: 8 tablet, Rfl: 0  Observations/Objective: Patient is well-developed, well-nourished in no acute distress.  Resting comfortably at home.  Head is normocephalic, atraumatic.  No labored breathing.  Speech is clear and coherent with logical content.  Patient is alert and oriented at baseline.    Assessment and Plan: 1. Sigmoid diverticulitis - amoxicillin-clavulanate (AUGMENTIN) 875-125 MG tablet; Take 1 tablet by mouth 2 (two) times daily.  Dispense: 14 tablet; Refill: 0  - Recurrent issue - Has had 4 times this year now - Has had 2 CT A/P both showing uncomplicated sigmoid diverticulitis - Will treat with Augmentin as that has worked well in the past - Discussed liquid diet until pain resolves then slowly increase diet as tolerated - Discussed starting food diary when occurrences happen to see if any common trigger to avoid - He is trying to get Medicaid insurance at this time, once he has insurance he wants to establish with a PCP and referred to GI - If symptoms worsen or fail to resolve with treatment please seek in person evaluation.   Follow Up Instructions: I discussed the assessment and treatment plan with the patient. The patient was provided an opportunity to ask questions and all were answered. The patient agreed with the plan and demonstrated an understanding of the instructions.  A copy of instructions were sent to the patient via MyChart unless otherwise noted below.    The patient was advised to call back or seek an in-person evaluation if the symptoms worsen or if the condition fails to improve as anticipated.  Time:  I spent 15 minutes with the patient via telehealth technology discussing the above problems/concerns.    Margaretann Loveless, PA-C

## 2021-12-12 NOTE — Patient Instructions (Signed)
Paul Rodgers, thank you for joining Margaretann Loveless, PA-C for today's virtual visit.  While this provider is not your primary care provider (PCP), if your PCP is located in our provider database this encounter information will be shared with them immediately following your visit.  Consent: (Patient) Paul Rodgers provided verbal consent for this virtual visit at the beginning of the encounter.  Current Medications:  Current Outpatient Medications:    amoxicillin-clavulanate (AUGMENTIN) 875-125 MG tablet, Take 1 tablet by mouth 2 (two) times daily., Disp: 14 tablet, Rfl: 0   hydroxypropyl methylcellulose / hypromellose (ISOPTO TEARS / GONIOVISC) 2.5 % ophthalmic solution, Place 1 drop into the right eye as needed for dry eyes., Disp: 15 mL, Rfl: 12   Multiple Vitamins-Minerals (ZINC PO), Take 1 tablet by mouth daily., Disp: , Rfl:    ondansetron (ZOFRAN) 4 MG tablet, Take 1 tablet (4 mg total) by mouth every 8 (eight) hours as needed for nausea or vomiting., Disp: 10 tablet, Rfl: 0   ondansetron (ZOFRAN) 4 MG tablet, Take 1 tablet (4 mg total) by mouth every 6 (six) hours as needed for nausea or vomiting., Disp: 8 tablet, Rfl: 0   Medications ordered in this encounter:  Meds ordered this encounter  Medications   amoxicillin-clavulanate (AUGMENTIN) 875-125 MG tablet    Sig: Take 1 tablet by mouth 2 (two) times daily.    Dispense:  14 tablet    Refill:  0    Order Specific Question:   Supervising Provider    Answer:   Hyacinth Meeker, BRIAN [[id number]]     *If you need refills on other medications prior to your next appointment, please contact your pharmacy*  Follow-Up: Call back or seek an in-person evaluation if the symptoms worsen or if the condition fails to improve as anticipated.  Other Instructions Diverticulitis  Diverticulitis is infection or inflammation of small pouches (diverticula) in the colon that form due to a condition called diverticulosis. Diverticula can trap stool  (feces) and bacteria, causing infection and inflammation. Diverticulitis may cause severe stomach pain and diarrhea. It may lead to tissue damage in the colon that causes bleeding or blockage. The diverticula may also burst (rupture) and cause infected stool to enter other areas of the abdomen. What are the causes? This condition is caused by stool becoming trapped in the diverticula, which allows bacteria to grow in the diverticula. This leads to inflammation and infection. What increases the risk? You are more likely to develop this condition if you have diverticulosis. The risk increases if you: Are overweight or obese. Do not get enough exercise. Drink alcohol. Use tobacco products. Eat a diet that has a lot of red meat such as beef, pork, or lamb. Eat a diet that does not include enough fiber. High-fiber foods include fruits, vegetables, beans, nuts, and whole grains. Are over 66 years of age. What are the signs or symptoms? Symptoms of this condition may include: Pain and tenderness in the abdomen. The pain is normally located on the left side of the abdomen, but it may occur in other areas. Fever and chills. Nausea. Vomiting. Cramping. Bloating. Changes in bowel routines. Blood in your stool. How is this diagnosed? This condition is diagnosed based on: Your medical history. A physical exam. Tests to make sure there is nothing else causing your condition. These tests may include: Blood tests. Urine tests. CT scan of the abdomen. How is this treated? Most cases of this condition are mild and can be treated at home.  Treatment may include: Taking over-the-counter pain medicines. Following a clear liquid diet. Taking antibiotic medicines by mouth. Resting. More severe cases may need to be treated at a hospital. Treatment may include: Not eating or drinking. Taking prescription pain medicine. Receiving antibiotic medicines through an IV. Receiving fluids and nutrition  through an IV. Surgery. When your condition is under control, your health care provider may recommend that you have a colonoscopy. This is an exam to look at the entire large intestine. During the exam, a lubricated, bendable tube is inserted into the anus and then passed into the rectum, colon, and other parts of the large intestine. A colonoscopy can show how severe your diverticula are and whether something else may be causing your symptoms. Follow these instructions at home: Medicines Take over-the-counter and prescription medicines only as told by your health care provider. These include fiber supplements, probiotics, and stool softeners. If you were prescribed an antibiotic medicine, take it as told by your health care provider. Do not stop taking the antibiotic even if you start to feel better. Ask your health care provider if the medicine prescribed to you requires you to avoid driving or using machinery. Eating and drinking  Follow a full liquid diet or another diet as directed by your health care provider. After your symptoms improve, your health care provider may tell you to change your diet. He or she may recommend that you eat a diet that contains at least 25 grams (25 g) of fiber daily. Fiber makes it easier to pass stool. Healthy sources of fiber include: Berries. One cup contains 4-8 grams of fiber. Beans or lentils. One-half cup contains 5-8 grams of fiber. Green vegetables. One cup contains 4 grams of fiber. Avoid eating red meat. General instructions Do not use any products that contain nicotine or tobacco, such as cigarettes, e-cigarettes, and chewing tobacco. If you need help quitting, ask your health care provider. Exercise for at least 30 minutes, 3 times each week. You should exercise hard enough to raise your heart rate and break a sweat. Keep all follow-up visits as told by your health care provider. This is important. You may need to have a colonoscopy. Contact a health  care provider if: Your pain does not improve. Your bowel movements do not return to normal. Get help right away if: Your pain gets worse. Your symptoms do not get better with treatment. Your symptoms suddenly get worse. You have a fever. You vomit more than one time. You have stools that are bloody, black, or tarry. Summary Diverticulitis is infection or inflammation of small pouches (diverticula) in the colon that form due to a condition called diverticulosis. Diverticula can trap stool (feces) and bacteria, causing infection and inflammation. You are at higher risk for this condition if you have diverticulosis and you eat a diet that does not include enough fiber. Most cases of this condition are mild and can be treated at home. More severe cases may need to be treated at a hospital. When your condition is under control, your health care provider may recommend that you have an exam called a colonoscopy. This exam can show how severe your diverticula are and whether something else may be causing your symptoms. Keep all follow-up visits as told by your health care provider. This is important. This information is not intended to replace advice given to you by your health care provider. Make sure you discuss any questions you have with your health care provider. Document Revised: 02/20/2019 Document Reviewed:  02/20/2019 Elsevier Patient Education  2023 [employer]    If you have been instructed to have an in-person evaluation today at a local Urgent Care facility, please use the link below. It will take you to a list of all of our available [facility], including address, phone number and hours of operation. Please do not delay care.  [facility]  If you or a family member do not have a primary care provider, use the link below to schedule a visit and establish care. When you choose a [facility] primary care physician or advanced practice provider, you gain a  long-term partner in health. Find a Primary Care Provider  Learn more about [facility]'s in-office and virtual care options:  - Get Care Now

## 2022-07-07 ENCOUNTER — Emergency Department (HOSPITAL_COMMUNITY)
Admission: EM | Admit: 2022-07-07 | Discharge: 2022-07-08 | Disposition: A | Discharge status: Home / Self Care | Payer: Medicaid Other | Attending: Emergency Medicine | Admitting: Emergency Medicine

## 2022-07-07 ENCOUNTER — Encounter (HOSPITAL_COMMUNITY): Payer: Self-pay

## 2022-07-07 ENCOUNTER — Other Ambulatory Visit: Payer: Self-pay

## 2022-07-07 ENCOUNTER — Emergency Department (HOSPITAL_COMMUNITY): Payer: Medicaid Other

## 2022-07-07 DIAGNOSIS — K5792 Diverticulitis of intestine, part unspecified, without perforation or abscess without bleeding: Secondary | ICD-10-CM

## 2022-07-07 DIAGNOSIS — Z1152 Encounter for screening for COVID-19: Secondary | ICD-10-CM | POA: Diagnosis not present

## 2022-07-07 DIAGNOSIS — R109 Unspecified abdominal pain: Secondary | ICD-10-CM | POA: Diagnosis present

## 2022-07-07 DIAGNOSIS — K5732 Diverticulitis of large intestine without perforation or abscess without bleeding: Secondary | ICD-10-CM | POA: Diagnosis not present

## 2022-07-07 LAB — CBC WITH DIFFERENTIAL/PLATELET
Abs Immature Granulocytes: 0.04 10*3/uL (ref 0.00–0.07)
Basophils Absolute: 0 10*3/uL (ref 0.0–0.1)
Basophils Relative: 0 %
Eosinophils Absolute: 0.1 10*3/uL (ref 0.0–0.5)
Eosinophils Relative: 1 %
HCT: 41.1 % (ref 39.0–52.0)
Hemoglobin: 14.4 g/dL (ref 13.0–17.0)
Immature Granulocytes: 0 %
Lymphocytes Relative: 9 %
Lymphs Abs: 1.1 10*3/uL (ref 0.7–4.0)
MCH: 29.8 pg (ref 26.0–34.0)
MCHC: 35 g/dL (ref 30.0–36.0)
MCV: 84.9 fL (ref 80.0–100.0)
Monocytes Absolute: 0.9 10*3/uL (ref 0.1–1.0)
Monocytes Relative: 7 %
Neutro Abs: 9.4 10*3/uL — ABNORMAL HIGH (ref 1.7–7.7)
Neutrophils Relative %: 83 %
Platelets: 184 10*3/uL (ref 150–400)
RBC: 4.84 MIL/uL (ref 4.22–5.81)
RDW: 12 % (ref 11.5–15.5)
WBC: 11.5 10*3/uL — ABNORMAL HIGH (ref 4.0–10.5)
nRBC: 0 % (ref 0.0–0.2)

## 2022-07-07 LAB — COMPREHENSIVE METABOLIC PANEL
ALT: 55 U/L — ABNORMAL HIGH (ref 0–44)
AST: 40 U/L (ref 15–41)
Albumin: 4 g/dL (ref 3.5–5.0)
Alkaline Phosphatase: 83 U/L (ref 38–126)
Anion gap: 9 (ref 5–15)
BUN: 15 mg/dL (ref 6–20)
CO2: 24 mmol/L (ref 22–32)
Calcium: 9.1 mg/dL (ref 8.9–10.3)
Chloride: 101 mmol/L (ref 98–111)
Creatinine, Ser: 0.91 mg/dL (ref 0.61–1.24)
GFR, Estimated: >60 mL/min (ref >60)
Glucose, Bld: 117 mg/dL — ABNORMAL HIGH (ref 70–99)
Potassium: 3.7 mmol/L (ref 3.5–5.1)
Sodium: 134 mmol/L — ABNORMAL LOW (ref 135–145)
Total Bilirubin: 1.1 mg/dL (ref 0.3–1.2)
Total Protein: 6.7 g/dL (ref 6.5–8.1)

## 2022-07-07 LAB — URINALYSIS, ROUTINE W REFLEX MICROSCOPIC
Bilirubin Urine: NEGATIVE
Glucose, UA: NEGATIVE mg/dL
Hgb urine dipstick: NEGATIVE
Ketones, ur: NEGATIVE mg/dL
Leukocytes,Ua: NEGATIVE
Nitrite: NEGATIVE
Protein, ur: NEGATIVE mg/dL
Specific Gravity, Urine: 1.009 (ref 1.005–1.030)
pH: 6 (ref 5.0–8.0)

## 2022-07-07 LAB — LIPASE, BLOOD: Lipase: 40 U/L (ref 11–51)

## 2022-07-07 LAB — RESP PANEL BY RT-PCR (RSV, FLU A&B, COVID)  RVPGX2
Influenza A by PCR: NEGATIVE
Influenza B by PCR: NEGATIVE
Resp Syncytial Virus by PCR: NEGATIVE
SARS Coronavirus 2 by RT PCR: NEGATIVE

## 2022-07-07 LAB — MAGNESIUM: Magnesium: 1.9 mg/dL (ref 1.7–2.4)

## 2022-07-07 MED ORDER — ONDANSETRON HCL 4 MG/2ML IJ SOLN
4.0000 mg | Freq: Four times a day (QID) | INTRAMUSCULAR | Status: DC | PRN
  Administered 2022-07-07: 4 mg via INTRAVENOUS
  Filled 2022-07-07: qty 2

## 2022-07-07 MED ORDER — LACTATED RINGERS IV BOLUS
1000.0000 mL | Freq: Once | INTRAVENOUS | Status: AC
  Administered 2022-07-07: 1000 mL via INTRAVENOUS

## 2022-07-07 MED ORDER — HYDROMORPHONE HCL 1 MG/ML IJ SOLN
0.5000 mg | Freq: Once | INTRAMUSCULAR | Status: AC
  Administered 2022-07-07: 0.5 mg via INTRAVENOUS
  Filled 2022-07-07: qty 1

## 2022-07-07 MED ORDER — ACETAMINOPHEN 325 MG PO TABS
650.0000 mg | ORAL_TABLET | Freq: Once | ORAL | Status: AC
  Administered 2022-07-07: 650 mg via ORAL
  Filled 2022-07-07: qty 2

## 2022-07-07 MED ORDER — IOHEXOL 300 MG/ML  SOLN
100.0000 mL | Freq: Once | INTRAMUSCULAR | Status: AC | PRN
  Administered 2022-07-07: 100 mL via INTRAVENOUS

## 2022-07-07 NOTE — ED Provider Notes (Signed)
[place] EMERGENCY DEPARTMENT AT [facility] Provider Note   CSN: [id number] Arrival date & time: 07/07/22  2033     History {Add pertinent medical, surgical, social history, OB history to HPI:1} No chief complaint on file.   Paul Rodgers is a 37 y.o. male.  HPI Patient presents for abdominal pain.  Medical history includes diverticulitis.  Over the past 6 years, he has had 4-5 episodes of diverticulitis flares.  This morning, he was in his normal state of health.  At around noon, he began to develop lower abdominal pain.  This pain worsened throughout the evening.  Currently, pain is located in lower abdomen and is slightly worse on the left side.  Pain is worsened with movements.  He has had nausea today when the pain worsens but he has not thrown up.  He has had several small soft bowel movements today.  He denies any melena or hematochezia.  He last ate at 6 PM.    Home Medications Prior to Admission medications   Medication Sig Start Date End Date Taking? Authorizing Provider  amoxicillin-clavulanate (AUGMENTIN) 875-125 MG tablet Take 1 tablet by mouth 2 (two) times daily. 12/12/21   Mar Daring, PA-C  hydroxypropyl methylcellulose / hypromellose (ISOPTO TEARS / GONIOVISC) 2.5 % ophthalmic solution Place 1 drop into the right eye as needed for dry eyes. 12/10/19   Curatolo, Adam, DO  Multiple Vitamins-Minerals (ZINC PO) Take 1 tablet by mouth daily.    [provider]  ondansetron (ZOFRAN) 4 MG tablet Take 1 tablet (4 mg total) by mouth every 8 (eight) hours as needed for nausea or vomiting. 05/01/19   Nils Flack, Mina A, PA-C  ondansetron (ZOFRAN) 4 MG tablet Take 1 tablet (4 mg total) by mouth every 6 (six) hours as needed for nausea or vomiting. 08/14/21   Jeanell Sparrow, DO      Allergies    Patient has no known allergies.    Review of Systems   Review of Systems  Gastrointestinal:  Positive for abdominal pain and nausea.  All other systems  reviewed and are negative.   Physical Exam Updated Vital Signs There were no vitals taken for this visit. Physical Exam Vitals and nursing note reviewed.  Constitutional:      General: He is not in acute distress.    Appearance: Normal appearance. He is well-developed. He is not ill-appearing, toxic-appearing or diaphoretic.  HENT:     Head: Normocephalic and atraumatic.     Right Ear: External ear normal.     Left Ear: External ear normal.     Nose: Nose normal.     Mouth/Throat:     Mouth: Mucous membranes are moist.  Eyes:     General: No scleral icterus.    Extraocular Movements: Extraocular movements intact.     Conjunctiva/sclera: Conjunctivae normal.  Cardiovascular:     Rate and Rhythm: Normal rate and regular rhythm.  Pulmonary:     Effort: Pulmonary effort is normal. No respiratory distress.  Abdominal:     General: There is no distension.     Palpations: Abdomen is soft.     Tenderness: There is abdominal tenderness. There is no guarding or rebound.  Musculoskeletal:        General: No swelling. Normal range of motion.     Cervical back: Normal range of motion and neck supple.     Right lower leg: No edema.     Left lower leg: No edema.  Skin:  General: Skin is warm and dry.     Coloration: Skin is not jaundiced or pale.  Neurological:     General: No focal deficit present.     Mental Status: He is alert and oriented to person, place, and time.     Cranial Nerves: No cranial nerve deficit.     Sensory: No sensory deficit.     Motor: No weakness.     Coordination: Coordination normal.  Psychiatric:        Mood and Affect: Mood normal.        Behavior: Behavior normal.        Thought Content: Thought content normal.        Judgment: Judgment normal.     ED Results / Procedures / Treatments   Labs (all labs ordered are listed, but only abnormal results are displayed) Labs Reviewed - No data to display  EKG None  Radiology No results  found.  Procedures Procedures  {Document cardiac monitor, telemetry assessment procedure when appropriate:1}  Medications Ordered in ED Medications - No data to display  ED Course/ Medical Decision Making/ A&P   {   Click here for ABCD2, HEART and other calculatorsREFRESH Note before signing :1}                          Medical Decision Making  This patient presents to the ED for concern of ***, this involves an extensive number of treatment options, and is a complaint that carries with it a high risk of complications and morbidity.  The differential diagnosis includes ***   Co morbidities that complicate the patient evaluation  ***   Additional history obtained:  Additional history obtained from *** External records from outside source obtained and reviewed including ***   Lab Tests:  I Ordered, and personally interpreted labs.  The pertinent results include:  ***   Imaging Studies ordered:  I ordered imaging studies including ***  I independently visualized and interpreted imaging which showed *** I agree with the radiologist interpretation   Cardiac Monitoring: / EKG:  The patient was maintained on a cardiac monitor.  I personally viewed and interpreted the cardiac monitored which showed an underlying rhythm of: ***   Consultations Obtained:  I requested consultation with the ***,  and discussed lab and imaging findings as well as pertinent plan - they recommend: ***   Problem List / ED Course / Critical interventions / Medication management  Patient with history of multiple diverticulitis episodes, presenting for acute onset of lower abdominal pain starting today and worsening throughout the afternoon and evening.  On arrival in the ED, patient is well-appearing.  Vital signs are reassuring.  Heart rate is slightly elevated.  His abdomen is soft and he does endorse some tenderness in lower abdomen, greater on the left side.  Patient was given IV fluids and  pain medication.  Currently, he is not experiencing nausea.  As needed Zofran was ordered.  Lab work and CT imaging were ordered.***. I ordered medication including ***  for ***  Reevaluation of the patient after these medicines showed that the patient {resolved/improved/worsened:23923::"improved"} I have reviewed the patients home medicines and have made adjustments as needed   Social Determinants of Health:  ***   Test / Admission - Considered:  ***   {Document critical care time when appropriate:1} {Document review of labs and clinical decision tools ie heart score, Chads2Vasc2 etc:1}  {Document your independent review of radiology  images, and any outside records:1} {Document your discussion with family members, caretakers, and with consultants:1} {Document social determinants of health affecting pt's care:1} {Document your decision making why or why not admission, treatments were needed:1} Final Clinical Impression(s) / ED Diagnoses Final diagnoses:  None    Rx / DC Orders ED Discharge Orders     None

## 2022-07-07 NOTE — ED Triage Notes (Signed)
Sharp lower abdominal pain started 3 hrs pta, waves of nausea, hx diverticulitis

## 2022-07-08 MED ORDER — OXYCODONE-ACETAMINOPHEN 5-325 MG PO TABS
1.0000 | ORAL_TABLET | Freq: Once | ORAL | Status: AC
  Administered 2022-07-08: 1 via ORAL
  Filled 2022-07-08: qty 1

## 2022-07-08 MED ORDER — AMOXICILLIN-POT CLAVULANATE 875-125 MG PO TABS
1.0000 | ORAL_TABLET | Freq: Once | ORAL | Status: AC
  Administered 2022-07-08: 1 via ORAL
  Filled 2022-07-08: qty 1

## 2022-07-08 MED ORDER — AMOXICILLIN-POT CLAVULANATE 875-125 MG PO TABS: 1.0000 | ORAL_TABLET | Freq: Two times a day (BID) | ORAL | 0 refills | Status: DC

## 2022-07-08 MED ORDER — ONDANSETRON 4 MG PO TBDP: 4.0000 mg | ORAL_TABLET | Freq: Three times a day (TID) | ORAL | 0 refills | Status: DC | PRN

## 2022-07-08 MED ORDER — OXYCODONE-ACETAMINOPHEN 5-325 MG PO TABS: 1.0000 | ORAL_TABLET | Freq: Four times a day (QID) | ORAL | 0 refills | Status: AC | PRN

## 2022-07-08 NOTE — Discharge Instructions (Addendum)
There were prescriptions sent to your pharmacy.  Augmentin is an antibiotic.  Take this as prescribed.  Percocet is a narcotic pain medication.  Take this only as needed.  Take zofran as needed for nausea and vomiting.  Continue to stay hydrated.  Maintain a liquid diet today and tomorrow and advance diet slowly.  Return emergency department for any worsening of pain or any other worsening symptoms of concern.

## 2022-11-05 ENCOUNTER — Encounter: Payer: Self-pay | Admitting: Nurse Practitioner

## 2022-11-05 ENCOUNTER — Ambulatory Visit: Payer: Medicaid Other | Admitting: Nurse Practitioner

## 2022-11-05 ENCOUNTER — Other Ambulatory Visit (INDEPENDENT_AMBULATORY_CARE_PROVIDER_SITE_OTHER): Payer: Medicaid Other

## 2022-11-05 VITALS — BP 110/70 | HR 72 | Ht 66.0 in | Wt 187.0 lb

## 2022-11-05 DIAGNOSIS — K760 Fatty (change of) liver, not elsewhere classified: Secondary | ICD-10-CM | POA: Diagnosis not present

## 2022-11-05 DIAGNOSIS — K5792 Diverticulitis of intestine, part unspecified, without perforation or abscess without bleeding: Secondary | ICD-10-CM

## 2022-11-05 DIAGNOSIS — K5790 Diverticulosis of intestine, part unspecified, without perforation or abscess without bleeding: Secondary | ICD-10-CM

## 2022-11-05 LAB — HEPATIC FUNCTION PANEL
ALT: 46 U/L (ref 0–53)
AST: 32 U/L (ref 0–37)
Albumin: 4.7 g/dL (ref 3.5–5.2)
Alkaline Phosphatase: 75 U/L (ref 39–117)
Bilirubin, Direct: 0.1 mg/dL (ref 0.0–0.3)
Total Bilirubin: 0.6 mg/dL (ref 0.2–1.2)
Total Protein: 7.5 g/dL (ref 6.0–8.3)

## 2022-11-05 MED ORDER — NA SULFATE-K SULFATE-MG SULF 17.5-3.13-1.6 GM/177ML PO SOLN: ORAL | 0 refills | Status: DC

## 2022-11-05 NOTE — Progress Notes (Addendum)
11/05/2022 Paul Rodgers [id number] [birth year]   CHIEF COMPLAINT: Diverticulosis   HISTORY OF PRESENT ILLNESS: Paul Rodgers is a 37 year old male with a past medical history of hepatic steatosis and diverticulitis with a perforation in 2015 followed by subsequent episodes of diverticulitis. He presents to our office today as referred by Roselyn Reef, NP for further evaluation regarding recurrent diverticulitis. He is accompanied by his wife. His first episode of diverticulitis occurred in 2015 which was complicated with the perforation and required hospital admission, IV antibiotics and bowl rest. Surgical intervention was not required. He went 2 or 3 years years without having any episodes of diverticulitis then had several episodes over the next few years which responded to oral antibiotics. Last year, he endorsed having 3-4 episodes of diverticulitis which were uncomplicated and resolved with oral antibiotics. His most recent episode of diverticulitis occurred 07/07/2022. At that time, he went to the ED and CTAP showed evidence of acute diverticulitis to the descending and proximal sigmoid colon without perforation or abscess. He was prescribed a course of Augmentin for 1 week and further GI follow-up to include a colonoscopy was recommended. He denies ever having a colonoscopy. His stress level was significantly elevated prior to his last episode of diverticulitis as his son was sick with croup and his grandmother was in the process of dying. He questions if eating onions triggered a few episodes of diverticulitis. No further episodes of diverticulitis or lower abdominal pain since 06/2022. He is passing a normal formed brown bowel movement most days. He denies having any constipation issues or straining when passing a bowel movement.  Paternal great grandfather with history of colorectal cancer. He was unaware of having hepatic steatosis as identified per past CTs.  He drinks 2 beers weekly.  He and his wife are eating a healthier diet over the past few months.     Latest Ref Rng & Units 07/07/2022    8:50 PM 11/06/2021   11:09 AM 08/14/2021    9:26 AM  CBC  WBC 4.0 - 10.5 K/uL 11.5  10.8  5.6   Hemoglobin 13.0 - 17.0 g/dL 13.2  44.0  10.2   Hematocrit 39.0 - 52.0 % 41.1  42.3  41.5   Platelets 150 - 400 K/uL 184  185  200         Latest Ref Rng & Units 07/07/2022    8:50 PM 11/06/2021   11:09 AM 08/14/2021    9:26 AM  CMP  Glucose 70 - 99 mg/dL 725  97  94   BUN 6 - 20 mg/dL 15  17  15    Creatinine 0.61 - 1.24 mg/dL 3.66  4.40  3.47   Sodium 135 - 145 mmol/L 134  137  135   Potassium 3.5 - 5.1 mmol/L 3.7  4.1  3.9   Chloride 98 - 111 mmol/L 101  102  101   CO2 22 - 32 mmol/L 24  27  26    Calcium 8.9 - 10.3 mg/dL 9.1  9.2  9.2   Total Protein 6.5 - 8.1 g/dL 6.7  7.6  7.4   Total Bilirubin 0.3 - 1.2 mg/dL 1.1  1.5  1.5   Alkaline Phos 38 - 126 U/L 83  90  97   AST 15 - 41 U/L 40  20  24   ALT 0 - 44 U/L 55  34  34      PAST IMAGE STUDIES:  CTAP 07/07/2022:  FINDINGS:  Lower chest: No acute abnormality.   Hepatobiliary: Liver is enlarged with right hepatic lobe measurement of 23 cm. Hepatic steatosis. Subcentimeter hypodensity within the left hepatic lobe too small to further characterize. No calcified gallstone or biliary dilatation   Pancreas: Unremarkable. No pancreatic ductal dilatation or surrounding inflammatory changes.   Spleen: Normal in size without focal abnormality.   Adrenals/Urinary Tract: Adrenal glands are unremarkable. Kidneys are normal, without renal calculi, focal lesion, or hydronephrosis. Bladder is unremarkable.   Stomach/Bowel: Stomach nonenlarged. Negative appendix. No dilated small bowel. Diverticular disease of the colon. Wall thickening and inflammation at the distal descending and proximal sigmoid colon consistent with diverticulitis. Similar findings on multiple previous exams. No perforation or abscess    Vascular/Lymphatic: No significant vascular findings are present. No enlarged abdominal or pelvic lymph nodes.   Reproductive: Prostate is unremarkable.   Other: Negative for pelvic effusion or free air   Musculoskeletal: No acute or significant osseous findings.   IMPRESSION: 1. Findings consistent with acute diverticulitis involving the distal descending and proximal sigmoid colon. No perforation or abscess. Correlation with colonoscopy after resolution of acute symptoms should be considered if not already performed to exclude other causes of inflammatory wall thickening. 2. Hepatomegaly and hepatic steatosis.   CTAP 08/14/2021: Similar appearance to the study of December 2020. Acute diverticulitis of the distal descending colon. Surrounding inflammatory change in the fat but without evidence of abscess or free air.  Diffuse fatty change of the liver, similar in degree.  CTAP 05/01/2019: 1. Uncomplicated distal descending colonic diverticulitis. No evidence of perforation or abscess formation. 2. Hepatic steatosis.  CTAP 02/18/2014: Fatty infiltration of the liver.   Moderate distention of the urinary bladder.   Normal appendix.   Focal diverticulitis is seen involving the proximal sigmoid colon  with moderate amount of gas adjacent to the inflamed bowel  consistent with perforation. No definite abscess or defined fluid  collection is seen at this time.    Past Medical History:  Diagnosis Date   Diverticulitis    Diverticulosis    Past Surgical History:  Procedure Laterality Date   HERNIA REPAIR     At age of 78   Social History: He is married.  He has 1 son and 1 daughter.  He works for a [employer].  Non-smoker.  He drinks 2 beers weekly.  No drug use.  Family History: Paternal great grandfather had colon cancer.  Father with history of hypertension.  No Known Allergies    Outpatient Encounter Medications as of 11/05/2022  Medication Sig    amoxicillin-clavulanate (AUGMENTIN) 875-125 MG tablet Take 1 tablet by mouth every 12 (twelve) hours.   hydroxypropyl methylcellulose / hypromellose (ISOPTO TEARS / GONIOVISC) 2.5 % ophthalmic solution Place 1 drop into the right eye as needed for dry eyes.   Multiple Vitamins-Minerals (ZINC PO) Take 1 tablet by mouth daily.   ondansetron (ZOFRAN) 4 MG tablet Take 1 tablet (4 mg total) by mouth every 8 (eight) hours as needed for nausea or vomiting.   ondansetron (ZOFRAN) 4 MG tablet Take 1 tablet (4 mg total) by mouth every 6 (six) hours as needed for nausea or vomiting.   ondansetron (ZOFRAN-ODT) 4 MG disintegrating tablet Take 1 tablet (4 mg total) by mouth every 8 (eight) hours as needed for nausea or vomiting.   No facility-administered encounter medications on file as of 11/05/2022.    REVIEW OF SYSTEMS:  Gen: Denies fever, sweats or chills. No weight loss.  CV: Denies chest  pain, palpitations or edema. Resp: Denies cough, shortness of breath of hemoptysis.  GI: See HPI.  GU : Denies urinary burning, blood in urine, increased urinary frequency or incontinence. MS: Denies joint pain, muscles aches or weakness. Derm: Denies rash, itchiness, skin lesions or unhealing ulcers. Psych: Denies depression, anxiety, memory loss or confusion. Heme: Denies bruising, easy bleeding. Neuro:  Denies headaches, dizziness or paresthesias. Endo:  Denies any problems with DM, thyroid or adrenal function.  PHYSICAL EXAM: BP 110/70   Pulse 72   Ht 5\' 6"  (1.676 m)   Wt 187 lb (84.8 kg)   BMI 30.18 kg/m   General: 37 year old male in no acute distress. Head: Normocephalic and atraumatic. Eyes:  Sclerae non-icteric, conjunctive pink. Ears: Normal auditory acuity. Mouth: Dentition intact. No ulcers or lesions.  Neck: Supple, no lymphadenopathy or thyromegaly.  Lungs: Clear bilaterally to auscultation without wheezes, crackles or rhonchi. Heart: Regular rate and rhythm. No murmur, rub or gallop  appreciated.  Abdomen: Soft, nontender, nondistended. No masses. No hepatosplenomegaly. Normoactive bowel sounds x 4 quadrants.  Rectal: Deferred. Musculoskeletal: Symmetrical with no gross deformities. Skin: Warm and dry. No rash or lesions on visible extremities.  Multiple tattoos. Extremities: No edema. Neurological: Alert oriented x 4, no focal deficits.  Psychological:  Alert and cooperative. Normal mood and affect.  ASSESSMENT AND PLAN:  37 year old male with a history of acute sigmoid diverticulitis with a perforation 01/2014 with recurrent sigmoid/descending colon diverticulitis.  His most recent episode of diverticulitis was 07/07/2022, CTAP showed acute diverticulitis to the distal descending colon and proximal sigmoid colon without perforation or abscess and resolved after a course of Augmentin without further recurrence since then.  Never had a colonoscopy. -Diagnostic colonoscopy benefits and risks discussed including risk with sedation, risk of bleeding, perforation and infection  -MiraLAX nightly as needed to avoid constipation/straining -Patient to contact our office if lower abdominal pain recurs -Consider informative colorectal surgery consult due to young onset of complex diverticulitis with a perforation in 2015 followed by subsequent uncomplicated episodes of diverticulitis.  Patient to proceed with colonoscopy first then will decision regarding surgical consult.  Hepatic steatosis identified per CT.  ALT level 55. -Patient instructed to reduce carbohydrates in the diet, exercise as tolerated and lose 10 pounds to reduce the risk of developing fatty liver disease -Hepatic panel today -Recommend follow-up appointment in 2 to 3 months        CC:  Med[facility], [facility]

## 2022-11-05 NOTE — Patient Instructions (Addendum)
You have been scheduled for a colonoscopy. Please follow written instructions given to you at your visit today.  Please pick up your prep supplies at the pharmacy within the next 1-3 days. If you use inhalers (even only as needed), please bring them with you on the day of your procedure.  Your provider has requested that you go to the basement level for lab work before leaving today. Press "B" on the elevator. The lab is located at the first door on the left as you exit the elevator.  Reduce carbohydrates in diet: bread, pasta, sweets, potatoes, rice  Exercise as tolerated.  Recommend 10 lb weight loss.  Miralax- every night as needed.  Due to recent changes in healthcare laws, you may see the results of your imaging and laboratory studies on MyChart before your provider has had a chance to review them.  We understand that in some cases there may be results that are confusing or concerning to you. Not all laboratory results come back in the same time frame and the provider may be waiting for multiple results in order to interpret others.  Please give [place] 48 hours in order for your provider to thoroughly review all the results before contacting the office for clarification of your results.   Thank you for trusting me with your gastrointestinal care!   Alcide Evener, CRNP

## 2022-11-05 NOTE — Progress Notes (Signed)
Agree with assessment / plan as outlined.  

## 2022-11-30 ENCOUNTER — Encounter: Payer: Medicaid Other | Admitting: Gastroenterology

## 2022-12-07 ENCOUNTER — Telehealth: Payer: Self-pay | Admitting: Nurse Practitioner

## 2022-12-07 ENCOUNTER — Other Ambulatory Visit: Payer: Self-pay

## 2022-12-07 ENCOUNTER — Other Ambulatory Visit (INDEPENDENT_AMBULATORY_CARE_PROVIDER_SITE_OTHER): Payer: Medicaid Other

## 2022-12-07 DIAGNOSIS — K5792 Diverticulitis of intestine, part unspecified, without perforation or abscess without bleeding: Secondary | ICD-10-CM | POA: Diagnosis not present

## 2022-12-07 LAB — COMPREHENSIVE METABOLIC PANEL
ALT: 34 U/L (ref 0–53)
AST: 17 U/L (ref 0–37)
Albumin: 4.7 g/dL (ref 3.5–5.2)
Alkaline Phosphatase: 89 U/L (ref 39–117)
BUN: 12 mg/dL (ref 6–23)
CO2: 29 mEq/L (ref 19–32)
Calcium: 10 mg/dL (ref 8.4–10.5)
Chloride: 102 mEq/L (ref 96–112)
Creatinine, Ser: 1.03 mg/dL (ref 0.40–1.50)
GFR: 91.71 mL/min (ref >60.00)
Glucose, Bld: 94 mg/dL (ref 70–99)
Potassium: 4 mEq/L (ref 3.5–5.1)
Sodium: 137 mEq/L (ref 135–145)
Total Bilirubin: 1.3 mg/dL — ABNORMAL HIGH (ref 0.2–1.2)
Total Protein: 7.5 g/dL (ref 6.0–8.3)

## 2022-12-07 LAB — CBC WITH DIFFERENTIAL/PLATELET
Basophils Absolute: 0 10*3/uL (ref 0.0–0.1)
Basophils Relative: 0.4 % (ref 0.0–3.0)
Eosinophils Absolute: 0 10*3/uL (ref 0.0–0.7)
Eosinophils Relative: 0.4 % (ref 0.0–5.0)
HCT: 44.1 % (ref 39.0–52.0)
Hemoglobin: 15 g/dL (ref 13.0–17.0)
Lymphocytes Relative: 21.2 % (ref 12.0–46.0)
Lymphs Abs: 2.1 10*3/uL (ref 0.7–4.0)
MCHC: 33.9 g/dL (ref 30.0–36.0)
MCV: 86 fl (ref 78.0–100.0)
Monocytes Absolute: 0.7 10*3/uL (ref 0.1–1.0)
Monocytes Relative: 6.7 % (ref 3.0–12.0)
Neutro Abs: 7.2 10*3/uL (ref 1.4–7.7)
Neutrophils Relative %: 71.3 % (ref 43.0–77.0)
Platelets: 253 10*3/uL (ref 150.0–400.0)
RBC: 5.12 Mil/uL (ref 4.22–5.81)
RDW: 13 % (ref 11.5–15.5)
WBC: 10.1 10*3/uL (ref 4.0–10.5)

## 2022-12-07 MED ORDER — AMOXICILLIN-POT CLAVULANATE 875-125 MG PO TABS
1.0000 | ORAL_TABLET | Freq: Two times a day (BID) | ORAL | 0 refills | Status: DC
Start: 2022-12-07 — End: 2022-12-14

## 2022-12-07 NOTE — Telephone Encounter (Signed)
Paul Rodgers, pls send patient to our lab for a CBC and CMP today. Send in Rx for Augmentin 875mg  one tab po bid. If no improvement within the next day or two he will need stat CTAP. Patient to go to the ED if he has worsening abdominal pain, prior history of diverticulitis with perforation in 2015, acute diverticulitis 06/2022. He needs an office follow up appt in 1 to 2 weeks, will need to verify if ok to proceed with planned colonoscopy in August, may need to reschedule to a later date.   Dr. Adela Lank, [place]

## 2022-12-07 NOTE — Telephone Encounter (Signed)
Pt was made aware of Alcide Evener NP recommendations: Orders for labs placed in Epic:  Pt made aware. Prescription sent to pharmacy.Pt made aware  Pt was notified that Alcide Evener NP is requesting an office visit in 1-2 weeks.  Pt stated that he is very busy these next 2 weeks. Pt agreed on 12/21/2022 at 1:30: Appointment has a 7 day hold. Reminder placed to scheduled pt on 12/14/2022 for the 12/21/2022. Pt made aware.  Pt notified If no improvement within the next day or two he will need stat CTAP. Patient to go to the ED if he has worsening abdominal pain, prior history of diverticulitis with perforation  Pt verbalized understanding with all questions answered.

## 2022-12-07 NOTE — Telephone Encounter (Signed)
Agree with your plan Jill Side, thanks for letting me know

## 2022-12-07 NOTE — Telephone Encounter (Signed)
Inbound call from patient stating he is having diverticulitis symptoms and requesting for a medication to be sent into his pharmacy. Stated he was advised to call if he starts having symptoms. Please advise, thank you.

## 2022-12-07 NOTE — Telephone Encounter (Signed)
Pt stated that he is having very familiar diverticulitis symptoms which he has had in the past. Pt stated that on Saturday he started having multiple small formed BMs, some on Sunday morning but then felt like it shifted from multiple BM to the feeling of being constipated, gassy, and bloating. Pt stated that Sunday afternoon he started having left lower abdominal pain and he immediately started on a liquid diet. Pt stated that today that he is  sore and tender to the left lower abdomen.  No fever, chills, nausea: Last office visit on 11/05/2022 with Alcide Evener NP: History of Diverticulitis: Please advise

## 2022-12-14 ENCOUNTER — Encounter (HOSPITAL_COMMUNITY): Payer: Self-pay

## 2022-12-14 ENCOUNTER — Emergency Department (HOSPITAL_COMMUNITY): Payer: Medicaid Other

## 2022-12-14 ENCOUNTER — Emergency Department (HOSPITAL_COMMUNITY)
Admission: EM | Admit: 2022-12-14 | Discharge: 2022-12-14 | Disposition: A | Discharge status: Home / Self Care | Payer: Medicaid Other | Attending: Emergency Medicine | Admitting: Emergency Medicine

## 2022-12-14 ENCOUNTER — Other Ambulatory Visit: Payer: Self-pay

## 2022-12-14 ENCOUNTER — Telehealth: Payer: Self-pay | Admitting: Nurse Practitioner

## 2022-12-14 DIAGNOSIS — K5793 Diverticulitis of intestine, part unspecified, without perforation or abscess with bleeding: Secondary | ICD-10-CM | POA: Insufficient documentation

## 2022-12-14 DIAGNOSIS — R1032 Left lower quadrant pain: Secondary | ICD-10-CM | POA: Diagnosis present

## 2022-12-14 DIAGNOSIS — K5792 Diverticulitis of intestine, part unspecified, without perforation or abscess without bleeding: Secondary | ICD-10-CM

## 2022-12-14 LAB — CBC WITH DIFFERENTIAL/PLATELET
Abs Immature Granulocytes: 0.03 10*3/uL (ref 0.00–0.07)
Basophils Absolute: 0 10*3/uL (ref 0.0–0.1)
Basophils Relative: 0 %
Eosinophils Absolute: 0 10*3/uL (ref 0.0–0.5)
Eosinophils Relative: 0 %
HCT: 44.3 % (ref 39.0–52.0)
Hemoglobin: 15 g/dL (ref 13.0–17.0)
Immature Granulocytes: 0 %
Lymphocytes Relative: 14 %
Lymphs Abs: 1.2 10*3/uL (ref 0.7–4.0)
MCH: 29.1 pg (ref 26.0–34.0)
MCHC: 33.9 g/dL (ref 30.0–36.0)
MCV: 86 fL (ref 80.0–100.0)
Monocytes Absolute: 0.6 10*3/uL (ref 0.1–1.0)
Monocytes Relative: 6 %
Neutro Abs: 7.2 10*3/uL (ref 1.7–7.7)
Neutrophils Relative %: 80 %
Platelets: 257 10*3/uL (ref 150–400)
RBC: 5.15 MIL/uL (ref 4.22–5.81)
RDW: 11.9 % (ref 11.5–15.5)
WBC: 9 10*3/uL (ref 4.0–10.5)
nRBC: 0 % (ref 0.0–0.2)

## 2022-12-14 LAB — COMPREHENSIVE METABOLIC PANEL
ALT: 24 U/L (ref 0–44)
AST: 20 U/L (ref 15–41)
Albumin: 4.5 g/dL (ref 3.5–5.0)
Alkaline Phosphatase: 78 U/L (ref 38–126)
Anion gap: 8 (ref 5–15)
BUN: 10 mg/dL (ref 6–20)
CO2: 24 mmol/L (ref 22–32)
Calcium: 9.1 mg/dL (ref 8.9–10.3)
Chloride: 105 mmol/L (ref 98–111)
Creatinine, Ser: 0.88 mg/dL (ref 0.61–1.24)
GFR, Estimated: >60 mL/min (ref >60)
Glucose, Bld: 107 mg/dL — ABNORMAL HIGH (ref 70–99)
Potassium: 4.2 mmol/L (ref 3.5–5.1)
Sodium: 137 mmol/L (ref 135–145)
Total Bilirubin: 1 mg/dL (ref 0.3–1.2)
Total Protein: 8.4 g/dL — ABNORMAL HIGH (ref 6.5–8.1)

## 2022-12-14 LAB — LIPASE, BLOOD: Lipase: 37 U/L (ref 11–51)

## 2022-12-14 MED ORDER — SODIUM CHLORIDE (PF) 0.9 % IJ SOLN
INTRAMUSCULAR | Status: AC
  Filled 2022-12-14: qty 50

## 2022-12-14 MED ORDER — METRONIDAZOLE 500 MG PO TABS: 500.0000 mg | ORAL_TABLET | Freq: Two times a day (BID) | ORAL | 0 refills | Status: AC

## 2022-12-14 MED ORDER — MORPHINE SULFATE (PF) 4 MG/ML IV SOLN
4.0000 mg | Freq: Once | INTRAVENOUS | Status: AC
  Administered 2022-12-14: 4 mg via INTRAVENOUS
  Filled 2022-12-14: qty 1

## 2022-12-14 MED ORDER — IOHEXOL 300 MG/ML  SOLN
100.0000 mL | Freq: Once | INTRAMUSCULAR | Status: AC | PRN
  Administered 2022-12-14: 100 mL via INTRAVENOUS

## 2022-12-14 MED ORDER — SODIUM CHLORIDE 0.9 % IV BOLUS
1000.0000 mL | Freq: Once | INTRAVENOUS | Status: AC
  Administered 2022-12-14: 1000 mL via INTRAVENOUS

## 2022-12-14 MED ORDER — CIPROFLOXACIN HCL 500 MG PO TABS: 500.0000 mg | ORAL_TABLET | Freq: Two times a day (BID) | ORAL | 0 refills | Status: AC

## 2022-12-14 MED ORDER — HYDROCODONE-ACETAMINOPHEN 5-325 MG PO TABS: 1.0000 | ORAL_TABLET | ORAL | 0 refills | Status: AC | PRN

## 2022-12-14 NOTE — Telephone Encounter (Signed)
Spoke with Pt.  Documented in alternate phone note.

## 2022-12-14 NOTE — Telephone Encounter (Signed)
Patient returned a call, however he stated he is on his way to ED at [facility] due to Diverticulitis.

## 2022-12-14 NOTE — ED Notes (Signed)
Pt arrived via POV, c/o medial stomach pain, denies any vomiting or diarrhea. States diverticulitis flare, had been taking abx with improvement, sx then worsening over the weekend.

## 2022-12-14 NOTE — Telephone Encounter (Signed)
Pt was scheduled for an office visit with Alcide Evener NP on 12/21/2022 at 1:30 PM. Pt was made aware. Pt stated that he was currently in the ED with Abdominal pain waiting on a CT scan. Routed as [employer]

## 2022-12-14 NOTE — ED Provider Notes (Signed)
[place] EMERGENCY DEPARTMENT AT [facility] Provider Note   CSN: [id number] Arrival date & time: 12/14/22  0818     History  Chief Complaint  Patient presents with   Abdominal Pain    Paul Rodgers is a 37 y.o. male.  HPI 37 year old male with a history of previous diverticulitis presents with abdominal pain.  He has been dealing with pain for over a week.  Primarily in his left lower abdomen and feels like prior diverticulitis.  He has been on antibiotics for almost a week from his GI doctor.  He has felt hot and cold at times but no actual fever to his knowledge.  The pain comes in waves.  However since last night the pain is acutely worse and closer to his midline and near his umbilicus.  Due to that he has presented to the ER.  No vomiting but at times when the pain hits he will get nauseated and feel he has to have a large bowel movement.  He had soft BMs but no diarrhea.  No blood in the stool. He has taken Tylenol with not much relief over last 24 hours.  Home Medications Prior to Admission medications   Medication Sig Start Date End Date Taking? Authorizing Provider  ciprofloxacin (CIPRO) 500 MG tablet Take 1 tablet (500 mg total) by mouth every 12 (twelve) hours for 7 days. 12/14/22 12/21/22 Yes Pricilla Loveless, MD  FIBER ADULT GUMMIES PO Take by mouth. 2 gummies daily    [provider]  HYDROcodone-acetaminophen (NORCO) 5-325 MG tablet Take 1 tablet by mouth every 4 (four) hours as needed for severe pain. 12/14/22  Yes Pricilla Loveless, MD  metroNIDAZOLE (FLAGYL) 500 MG tablet Take 1 tablet (500 mg total) by mouth 2 (two) times daily. 12/14/22  Yes Pricilla Loveless, MD  Multiple Vitamins-Minerals (ZINC PO) Take 1 tablet by mouth daily.    [provider]  Na Sulfate-K Sulfate-Mg Sulf 17.5-3.13-1.6 GM/177ML SOLN Use as directed; may use generic; goodrx card if insurance will not cover generic 11/05/22   Arnaldo Natal, NP  OVER THE COUNTER  MEDICATION OTC allergy medicine - one daily    [provider]      Allergies    Patient has no known allergies.    Review of Systems   Review of Systems  Constitutional:  Negative for fever.  Gastrointestinal:  Positive for abdominal pain and nausea. Negative for blood in stool, constipation, diarrhea and vomiting.  Genitourinary:  Negative for dysuria.    Physical Exam Updated Vital Signs BP 135/86 (BP Location: Right Arm)   Pulse 90   Temp 99.9 F (37.7 C) (Oral)   Resp 17   SpO2 99%  Physical Exam Vitals and nursing note reviewed.  Constitutional:      General: He is not in acute distress.    Appearance: He is well-developed. He is not ill-appearing or diaphoretic.  HENT:     Head: Normocephalic and atraumatic.  Cardiovascular:     Rate and Rhythm: Normal rate and regular rhythm.     Heart sounds: Normal heart sounds.  Pulmonary:     Effort: Pulmonary effort is normal.     Breath sounds: Normal breath sounds.  Abdominal:     Palpations: Abdomen is soft.     Tenderness: There is abdominal tenderness (mild) in the left lower quadrant.  Skin:    General: Skin is warm and dry.  Neurological:     Mental Status: He is alert.  ED Results / Procedures / Treatments   Labs (all labs ordered are listed, but only abnormal results are displayed) Labs Reviewed  COMPREHENSIVE METABOLIC PANEL - Abnormal; Notable for the following components:      Result Value   Glucose, Bld 107 (*)    Total Protein 8.4 (*)    All other components within normal limits  LIPASE, BLOOD  CBC WITH DIFFERENTIAL/PLATELET    EKG None  Radiology CT ABDOMEN PELVIS W CONTRAST  Result Date: 12/14/2022 CLINICAL DATA:  Diverticulitis EXAM: CT ABDOMEN AND PELVIS WITH CONTRAST TECHNIQUE: Multidetector CT imaging of the abdomen and pelvis was performed using the standard protocol following bolus administration of intravenous contrast. RADIATION DOSE REDUCTION: This exam was performed  according to the departmental dose-optimization program which includes automated exposure control, adjustment of the mA and/or kV according to patient size and/or use of iterative reconstruction technique. CONTRAST:  OMNIPAQUE IOHEXOL 300 MG/ML  SOLN COMPARISON:  07/07/2022 and older FINDINGS: Lower chest: There is some linear opacity along the lung bases likely scar or atelectasis. No pleural effusion. Hepatobiliary: Fatty liver infiltration identified diffusely. Patent portal vein. Gallbladder is nondilated. There is tiny low-attenuation lesion seen in segment 4, too small to completely characterize but likely a benign cystic lesion and no specific imaging follow-up. This also is unchanged from previous. Pancreas: Unremarkable. No pancreatic ductal dilatation or surrounding inflammatory changes. Spleen: Normal in size without focal abnormality. Adrenals/Urinary Tract: The adrenal glands are preserved. No enhancing renal mass or collecting system dilatation. Preserved contours of the urinary bladder. Stomach/Bowel: No oral contrast. Along the distal descending colon is an area of wall thickening with severe stranding. Few scattered colonic diverticula in this location and elsewhere. The large bowel has a normal course and caliber. No free air or free fluid. Normal appendix. The stomach and small bowel are nondilated. Vascular/Lymphatic: Minimal vascular calcifications. Normal caliber aorta and IVC. No specific abnormal lymph node enlargement identified in the abdomen and pelvis. Reproductive: Prostate is unremarkable. Other: No free air or free fluid. 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 IMPRESSION: In the area of previous abnormality, distal descending colon is a focal wall thickening with stranding consistent with an area of acute diverticulitis. No complicating features of obstruction, free air or abscess formation. Recommend follow-up to confirm clearance and exclude  secondary pathology. Fatty liver infiltration. Electronically Signed   By: Karen Kays M.D.   On: 12/14/2022 11:29    Procedures Procedures    Medications Ordered in ED Medications  sodium chloride 0.9 % bolus 1,000 mL (1,000 mLs Intravenous New Bag/Given 12/14/22 0914)  morphine (PF) 4 MG/ML injection 4 mg (4 mg Intravenous Given 12/14/22 0915)  iohexol (OMNIPAQUE) 300 MG/ML solution 100 mL (100 mLs Intravenous Contrast Given 12/14/22 1042)    ED Course/ Medical Decision Making/ A&P                             Medical Decision Making Amount and/or Complexity of Data Reviewed External Data Reviewed: notes.    Details: GI notes/messages over past week Labs: ordered.    Details: WBC normal.  No significant electrolyte disturbance.  Normal hemoglobin. Radiology: ordered and independent interpretation performed.    Details: Diverticulitis but no abscess or perforation patient was given a dose  Risk Prescription drug management.   Morphine for pain.  He is feeling a lot better.  His vital signs are stable and he is afebrile here.  And labs are normal his CT shows diverticulitis but no acute complication.  Unclear why the Augmentin did not seem to help but I think with his pain being controlled, no acute complication noted, and benign blood work, I think it is reasonable to try outpatient management and change up his antibiotics.  He is agreeable to this.  Will have him follow-up with his gastroenterologist and change him to Cipro and Flagyl.  Will give some hydrocodone for pain.  Discussed return precautions.        Final Clinical Impression(s) / ED Diagnoses Final diagnoses:  Acute diverticulitis    Rx / DC Orders ED Discharge Orders          Ordered    HYDROcodone-acetaminophen (NORCO) 5-325 MG tablet  Every 4 hours PRN        12/14/22 1142    ciprofloxacin (CIPRO) 500 MG tablet  Every 12 hours        12/14/22 1142    metroNIDAZOLE (FLAGYL) 500 MG tablet  2 times daily         12/14/22 1142              Pricilla Loveless, MD 12/14/22 1145

## 2022-12-14 NOTE — Discharge Instructions (Addendum)
Your CT scan shows diverticulitis.  Stop taking the Augmentin and we are switching you to 2 different antibiotics to use.  You may continue to take Tylenol and/or ibuprofen for pain and if that is not helping you can take the hydrocodone given.  Have caution as this has Tylenol in it.  Do not use this and operate heavy machinery, drive, and do not combine with other medications.  Follow-up with your gastroenterologist for further evaluation and recheck.  If you develop fever, new or worsening or not improved abdominal pain, vomiting, blood in the stool, or any other new/concerning symptoms then return to the ER or call 911.

## 2022-12-21 ENCOUNTER — Ambulatory Visit: Payer: Medicaid Other | Admitting: Nurse Practitioner

## 2022-12-21 ENCOUNTER — Other Ambulatory Visit: Payer: Self-pay

## 2022-12-21 ENCOUNTER — Encounter: Payer: Self-pay | Admitting: Nurse Practitioner

## 2022-12-21 VITALS — BP 118/78 | HR 92 | Ht 66.0 in | Wt 182.5 lb

## 2022-12-21 DIAGNOSIS — K5732 Diverticulitis of large intestine without perforation or abscess without bleeding: Secondary | ICD-10-CM | POA: Diagnosis not present

## 2022-12-21 DIAGNOSIS — K5790 Diverticulosis of intestine, part unspecified, without perforation or abscess without bleeding: Secondary | ICD-10-CM

## 2022-12-21 DIAGNOSIS — K5792 Diverticulitis of intestine, part unspecified, without perforation or abscess without bleeding: Secondary | ICD-10-CM

## 2022-12-21 DIAGNOSIS — K760 Fatty (change of) liver, not elsewhere classified: Secondary | ICD-10-CM

## 2022-12-21 NOTE — Progress Notes (Signed)
12/21/2022 Paul Rodgers [id number] 1985/11/08   Chief Complaint: Diverticulitis follow up   History of Present Illness: Paul Rodgers is a 37 year old male with a past medical history of hepatic steatosis and diverticulitis with a perforation in 2015 followed by subsequent episodes of diverticulitis.  Refer to office visit 11/05/2022 for comprehensive history review.  He contacted our office 12/07/2022 with recurrent LLQ pain. He was prescribed Augmentin 875mg  po bid x 10 days with instructions to go to the ED if his symptoms worsened.  He continued to have LLQ pain therefore he presented to the ED 12/14/2022 for further evaluation.  WBC 9.0.  Hemoglobin 15.  CTAP showed focal wall thickening with stranding consistent with an area of acute diverticulitis to the descending colon. No complicating features of obstruction, free air or abscess formation. Augmentin was discontinued and he was prescribed Cipro 500 mg twice daily and Metronidazole 500 mg twice daily for 7 days.  He will finish the course of antibiotics and 1 or 2 days.  He stated his LLQ pain abated the next day.  He is slowly advancing his diet.  He remains off fiber gummies for now.  He is scheduled for a colonoscopy with Dr. Adela Lank 01/08/2023, this procedure date will need to be rescheduled in 6 to 8 weeks.   PAST IMAGE STUDIES:  CTAP 12/14/2022: FINDINGS: Lower chest: There is some linear opacity along the lung bases likely scar or atelectasis. No pleural effusion.   Hepatobiliary: Fatty liver infiltration identified diffusely. Patent portal vein. Gallbladder is nondilated. There is tiny low-attenuation lesion seen in segment 4, too small to completely characterize but likely a benign cystic lesion and no specific imaging follow-up. This also is unchanged from previous.   Pancreas: Unremarkable. No pancreatic ductal dilatation or surrounding inflammatory changes.   Spleen: Normal in size without focal abnormality.    Adrenals/Urinary Tract: The adrenal glands are preserved. No enhancing renal mass or collecting system dilatation. Preserved contours of the urinary bladder.   Stomach/Bowel: No oral contrast. Along the distal descending colon is an area of wall thickening with severe stranding. Few scattered colonic diverticula in this location and elsewhere. The large bowel has a normal course and caliber. No free air or free fluid. Normal appendix. The stomach and small bowel are nondilated.   Vascular/Lymphatic: Minimal vascular calcifications. Normal caliber aorta and IVC. No specific abnormal lymph node enlargement identified in the abdomen and pelvis.   Reproductive: Prostate is unremarkable.   Other: No free air or free fluid.   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   IMPRESSION: In the area of previous abnormality, distal descending colon is a focal wall thickening with stranding consistent with an area of acute diverticulitis. No complicating features of obstruction, free air or abscess formation. Recommend follow-up to confirm clearance and exclude secondary pathology.   Fatty liver infiltration.     CTAP 07/07/2022:  FINDINGS: Lower chest: No acute abnormality.   Hepatobiliary: Liver is enlarged with right hepatic lobe measurement of 23 cm. Hepatic steatosis. Subcentimeter hypodensity within the left hepatic lobe too small to further characterize. No calcified gallstone or biliary dilatation   Pancreas: Unremarkable. No pancreatic ductal dilatation or surrounding inflammatory changes.   Spleen: Normal in size without focal abnormality.   Adrenals/Urinary Tract: Adrenal glands are unremarkable. Kidneys are normal, without renal calculi, focal lesion, or hydronephrosis. Bladder is unremarkable.   Stomach/Bowel: Stomach nonenlarged. Negative appendix. No dilated small bowel. Diverticular disease of the colon.  Wall thickening  and inflammation at the distal descending and proximal sigmoid colon consistent with diverticulitis. Similar findings on multiple previous exams. No perforation or abscess   Vascular/Lymphatic: No significant vascular findings are present. No enlarged abdominal or pelvic lymph nodes.   Reproductive: Prostate is unremarkable.   Other: Negative for pelvic effusion or free air   Musculoskeletal: No acute or significant osseous findings.   IMPRESSION: 1. Findings consistent with acute diverticulitis involving the distal descending and proximal sigmoid colon. No perforation or abscess. Correlation with colonoscopy after resolution of acute symptoms should be considered if not already performed to exclude other causes of inflammatory wall thickening. 2. Hepatomegaly and hepatic steatosis.   CTAP 08/14/2021: Similar appearance to the study of December 2020. Acute diverticulitis of the distal descending colon. Surrounding inflammatory change in the fat but without evidence of abscess or free air.  Diffuse fatty change of the liver, similar in degree.   CTAP 05/01/2019: 1. Uncomplicated distal descending colonic diverticulitis. No evidence of perforation or abscess formation. 2. Hepatic steatosis.   CTAP 02/18/2014: Fatty infiltration of the liver.   Moderate distention of the urinary bladder.   Normal appendix.   Focal diverticulitis is seen involving the proximal sigmoid colon  with moderate amount of gas adjacent to the inflamed bowel  consistent with perforation. No definite abscess or defined fluid  collection is seen at this time.      Current Outpatient Medications on File Prior to Visit  Medication Sig Dispense Refill   ciprofloxacin (CIPRO) 500 MG tablet Take 1 tablet (500 mg total) by mouth every 12 (twelve) hours for 7 days. 14 tablet 0   FIBER ADULT GUMMIES PO Take by mouth. 2 gummies daily     HYDROcodone-acetaminophen (NORCO) 5-325 MG tablet Take 1 tablet by  mouth every 4 (four) hours as needed for severe pain. 10 tablet 0   metroNIDAZOLE (FLAGYL) 500 MG tablet Take 1 tablet (500 mg total) by mouth 2 (two) times daily. 14 tablet 0   Multiple Vitamins-Minerals (ZINC PO) Take 1 tablet by mouth daily.     Na Sulfate-K Sulfate-Mg Sulf 17.5-3.13-1.6 GM/177ML SOLN Use as directed; may use generic; goodrx card if insurance will not cover generic 354 mL 0   OVER THE COUNTER MEDICATION OTC allergy medicine - one daily     No current facility-administered medications on file prior to visit.   No Known Allergies   Current Medications, Allergies, Past Medical History, Past Surgical History, Family History and Social History were reviewed in [employer] record.  Review of Systems:   Constitutional: Negative for fever, sweats, chills or weight loss.  Respiratory: Negative for shortness of breath.   Cardiovascular: Negative for chest pain, palpitations and leg swelling.  Gastrointestinal: See HPI.  Musculoskeletal: Negative for back pain or muscle aches.  Neurological: Negative for dizziness, headaches or paresthesias.    Physical Exam: Ht 5\' 6"  (1.676 m)   Wt 182 lb 8 oz (82.8 kg)   BMI 29.46 kg/m  General: 37 year old male in no acute distress. Head: Normocephalic and atraumatic. Eyes: No scleral icterus. Conjunctiva pink . Ears: Normal auditory acuity. Mouth: Dentition intact. No ulcers or lesions.  Lungs: Clear throughout to auscultation. Heart: Regular rate and rhythm, no murmur. Abdomen: Soft, nontender and nondistended. No masses or hepatomegaly. Normal bowel sounds x 4 quadrants.  Rectal: Deferred. Musculoskeletal: Symmetrical with no gross deformities. Extremities: No edema. Neurological: Alert oriented x 4. No focal deficits.  Psychological: Alert and cooperative. Normal mood  and affect  Assessment and Recommendations:  37 year old male with a history of acute sigmoid diverticulitis with a perforation 01/2014.  Recurrent sigmoid/descending colon diverticulitis 06/2022 (treated with Augmentin) and 11/2022 (no improvement on Augmentin, seen in the ED, switched to Cipro and Flagyl and LLQ pain abated within 24 hours without recurrence).  See CTAP results above. -Diagnostic colonoscopy benefits and risks discussed including risk with sedation, risk of bleeding, perforation and infection  -MiraLAX nightly as needed to avoid constipation/straining -Patient to contact our office if lower abdominal pain recurs -Consider informative colorectal surgery consult due to young onset of complex diverticulitis with a perforation in 2015 followed by subsequent uncomplicated episodes of diverticulitis.  Patient to proceed with colonoscopy first then will decision regarding surgical consult. -Drink 64 ounces of water daily -Slowly advance diet as tolerated -Restart fiber Gummies in 1 week -Reschedule colonoscopy in 6 weeks

## 2022-12-21 NOTE — Progress Notes (Signed)
Agree with assessment and plan as outlined.  

## 2022-12-21 NOTE — Patient Instructions (Addendum)
Contact our office if your abdominal pain recurs.  Colleen, NP will contact you if colonoscopy date needs to be changed.  Due to recent changes in healthcare laws, you may see the results of your imaging and laboratory studies on MyChart before your provider has had a chance to review them.  We understand that in some cases there may be results that are confusing or concerning to you. Not all laboratory results come back in the same time frame and the provider may be waiting for multiple results in order to interpret others.  Please give [place] 48 hours in order for your provider to thoroughly review all the results before contacting the office for clarification of your results.   Thank you for trusting me with your gastrointestinal care!   Alcide Evener, CRNP

## 2023-01-08 ENCOUNTER — Encounter: Payer: Medicaid Other | Admitting: Gastroenterology

## 2023-01-30 ENCOUNTER — Encounter: Payer: Self-pay | Admitting: Certified Registered Nurse Anesthetist

## 2023-02-03 ENCOUNTER — Encounter: Payer: Self-pay | Admitting: Gastroenterology

## 2023-02-03 ENCOUNTER — Ambulatory Visit (AMBULATORY_SURGERY_CENTER): Payer: Medicaid Other | Admitting: Gastroenterology

## 2023-02-03 VITALS — BP 90/45 | HR 73 | Temp 97.5°F | Resp 17 | Ht 66.0 in | Wt 182.0 lb

## 2023-02-03 DIAGNOSIS — K5790 Diverticulosis of intestine, part unspecified, without perforation or abscess without bleeding: Secondary | ICD-10-CM | POA: Diagnosis not present

## 2023-02-03 DIAGNOSIS — K5792 Diverticulitis of intestine, part unspecified, without perforation or abscess without bleeding: Secondary | ICD-10-CM

## 2023-02-03 DIAGNOSIS — D120 Benign neoplasm of cecum: Secondary | ICD-10-CM

## 2023-02-03 MED ORDER — SODIUM CHLORIDE 0.9 % IV SOLN: 500.0000 mL | Freq: Once | INTRAVENOUS | Status: DC

## 2023-02-03 NOTE — Progress Notes (Signed)
[facility] History and Physical   Primary Care Physician:  System, Provider Not In   Reason for Procedure:   diverticulitis  Plan:    colonoscopy     HPI: Paul Rodgers is a 37 y.o. male  here for colonoscopy to evaluate history of diverticulitis of left colon - starting in 2015 which led to hospitalization, and most recently 11/2022 - suspected descending colon based on CT scan. Reports subjectively numerous episodes of years that have been mild. No prior colonoscopy   Otherwise feels well without any cardiopulmonary symptoms.   I have discussed risks / benefits of anesthesia and endoscopic procedure with Paul Rodgers and they wish to proceed with the exams as outlined today.    Past Medical History:  Diagnosis Date   Bell's palsy 2020   got this the day after his 2nd Covid shot   COVID    Diverticulitis    Diverticulosis     Past Surgical History:  Procedure Laterality Date   HERNIA REPAIR     At age of 1    Prior to Admission medications   Medication Sig Start Date End Date Taking? Authorizing Provider  FIBER ADULT GUMMIES PO Take by mouth. 2 gummies daily   Yes [provider]  Multiple Vitamins-Minerals (ZINC PO) Take 1 tablet by mouth daily.   Yes [provider]  OVER THE COUNTER MEDICATION OTC allergy medicine - one daily   Yes [provider]  HYDROcodone-acetaminophen (NORCO) 5-325 MG tablet Take 1 tablet by mouth every 4 (four) hours as needed for severe pain. 12/14/22   Pricilla Loveless, MD  metroNIDAZOLE (FLAGYL) 500 MG tablet Take 1 tablet (500 mg total) by mouth 2 (two) times daily. 12/14/22   Pricilla Loveless, MD    Current Outpatient Medications  Medication Sig Dispense Refill   FIBER ADULT GUMMIES PO Take by mouth. 2 gummies daily     Multiple Vitamins-Minerals (ZINC PO) Take 1 tablet by mouth daily.     OVER THE COUNTER MEDICATION OTC allergy medicine - one daily     HYDROcodone-acetaminophen (NORCO) 5-325 MG  tablet Take 1 tablet by mouth every 4 (four) hours as needed for severe pain. 10 tablet 0   metroNIDAZOLE (FLAGYL) 500 MG tablet Take 1 tablet (500 mg total) by mouth 2 (two) times daily. 14 tablet 0   Current Facility-Administered Medications  Medication Dose Route Frequency Provider Last Rate Last Admin   0.9 %  sodium chloride infusion  500 mL Intravenous Once Alpa Salvo, Willaim Rayas, MD        Allergies as of 02/03/2023   (No Known Allergies)    Family History  Problem Relation Age of Onset   Hypertension Father    Cancer - Colon Other        pat great grand father   Heart attack Other        mat great grand father   Esophageal cancer Neg Hx    Colon cancer Neg Hx    Stomach cancer Neg Hx    Ulcerative colitis Neg Hx     Social History   Socioeconomic History   Marital status: Married    Spouse name: Not on file   Number of children: 3   Years of education: Not on file   Highest education level: Not on file  Occupational History   Occupation: [employer]  Tobacco Use   Smoking status: Never   Smokeless tobacco: Never  Vaping Use   Vaping status:  Never Used  Substance and Sexual Activity   Alcohol use: Yes    Comment: 2 beers a week   Drug use: No   Sexual activity: Not on file  Other Topics Concern   Not on file  Social History Narrative   Not on file   Social Determinants of Health   Financial Resource Strain: Not at Risk (03/17/2022)   Received from [place], [place]   [employer]    Financial Resource Strain: 1  Food Insecurity: Not at Risk (03/17/2022)   Received from [place], [place]   Food Insecurity    Food: 1  Transportation Needs: Not at Risk (03/17/2022)   Received from [place], [employer] Needs    Transportation: 1  Physical Activity: Not at Risk (03/17/2022)   Received from [place], [place]   Physical Activity    Physical Activity: 1  Stress: Not on File (03/17/2022)   Received from [facility], [place]   Stress     Stress: 0  Social Connections: Not on File (02/01/2023)   Received from [facility]   Social Connections    Connectedness: 0  Intimate Partner Violence: Unknown (08/29/2021)   Received from [facility], [facility]   HITS    Physically Hurt: Not on file    Insult or Talk Down To: Not on file    Threaten Physical Harm: Not on file    Scream or Curse: Not on file    Review of Systems: All other review of systems negative except as mentioned in the HPI.  Physical Exam: Vital signs BP 139/77   Pulse 78   Temp (!) 97.5 F (36.4 C) (Temporal)   Ht 5\' 6"  (1.676 m)   Wt 182 lb (82.6 kg)   SpO2 98%   BMI 29.38 kg/m   General:   Alert,  Well-developed, pleasant and cooperative in NAD Lungs:  Clear throughout to auscultation.   Heart:  Regular rate and rhythm Abdomen:  Soft, nontender and nondistended.   Neuro/Psych:  Alert and cooperative. Normal mood and affect. A and O x 3  Harlin Rain, MD [facility]

## 2023-02-03 NOTE — Progress Notes (Signed)
Report given to PACU, vss 

## 2023-02-03 NOTE — Op Note (Signed)
[facility] Patient Name: Paul Rodgers Procedure Date: 02/03/2023 2:10 PM MRN: [id number] Endoscopist: Viviann Spare P. Adela Lank , MD, [id number] Age: 37 Referring MD:  Date of Birth: [birth year] Gender: Male Account #: [id number] Procedure:                Colonoscopy Indications:              Follow-up of recurrent diverticulitis - numerous                            episodes per patient over years Medicines:                Monitored Anesthesia Care Procedure:                Pre-Anesthesia Assessment:                           - Prior to the procedure, a History and Physical                            was performed, and patient medications and                            allergies were reviewed. The patient's tolerance of                            previous anesthesia was also reviewed. The risks                            and benefits of the procedure and the sedation                            options and risks were discussed with the patient.                            All questions were answered, and informed consent                            was obtained. Prior Anticoagulants: The patient has                            taken no anticoagulant or antiplatelet agents. ASA                            Grade Assessment: II - A patient with mild systemic                            disease. After reviewing the risks and benefits,                            the patient was deemed in satisfactory condition to                            undergo the procedure.  After obtaining informed consent, the colonoscope                            was passed under direct vision. Throughout the                            procedure, the patient's blood pressure, pulse, and                            oxygen saturations were monitored continuously. The                            Olympus Scope SN: [id number] was introduced through                            the anus and advanced to  the the terminal ileum,                            with identification of the appendiceal orifice and                            IC valve. The colonoscopy was performed without                            difficulty. The patient tolerated the procedure                            well. The quality of the bowel preparation was                            good. The terminal ileum, ileocecal valve,                            appendiceal orifice, and rectum were photographed. Scope In: 2:29:44 PM Scope Out: 2:42:25 PM Scope Withdrawal Time: 0 hours 11 minutes 6 seconds  Total Procedure Duration: 0 hours 12 minutes 41 seconds  Findings:                 The perianal and digital rectal examinations were                            normal.                           The terminal ileum appeared normal.                           Three flat and sessile polyps were found in the                            cecum. The polyps were 2 to 5 mm in size. These                            polyps were removed with a cold snare. Resection  and retrieval were complete.                           Multiple small-mouthed diverticula were found in                            the transverse colon and left colon.                           Internal hemorrhoids were found during retroflexion.                           The exam was otherwise without abnormality. Complications:            No immediate complications. Estimated blood loss:                            Minimal. Estimated Blood Loss:     Estimated blood loss was minimal. Impression:               - The examined portion of the ileum was normal.                           - Three 2 to 5 mm polyps in the cecum, removed with                            a cold snare. Resected and retrieved.                           - Diverticulosis in the transverse colon and in the                            left colon.                           - Internal  hemorrhoids.                           - The examination was otherwise normal. Recommendation:           - Patient has a contact number available for                            emergencies. The signs and symptoms of potential                            delayed complications were discussed with the                            patient. Return to normal activities tomorrow.                            Written discharge instructions were provided to the                            patient.                           -  Resume previous diet.                           - Continue present medications.                           - Await pathology results.                           - Contact us with recurrent symptoms. If episodes                            of diverticulitis continue to recur, consideration                            for surgical consultation. Viviann Spare P. Jamail Cullers, MD 02/03/2023 2:47:44 PM This report has been signed electronically.

## 2023-02-03 NOTE — Progress Notes (Signed)
Called to room to assist during endoscopic procedure.  Patient ID and intended procedure confirmed with present staff. Received instructions for my participation in the procedure from the performing physician.  

## 2023-02-03 NOTE — Patient Instructions (Addendum)
Continue present medications. Await pathology results. Contact us with recurrent symptoms. If episodes of diverticulitis continue to recur, consideration for surgical consultation.                                                     YOU HAD AN ENDOSCOPIC PROCEDURE TODAY AT THE [facility]:   Refer to the procedure report that was given to you for any specific questions about what was found during the examination.  If the procedure report does not answer your questions, please call your gastroenterologist to clarify.  If you requested that your care partner not be given the details of your procedure findings, then the procedure report has been included in a sealed envelope for you to review at your convenience later.  YOU SHOULD EXPECT: Some feelings of bloating in the abdomen. Passage of more gas than usual.  Walking can help get rid of the air that was put into your GI tract during the procedure and reduce the bloating. If you had a lower endoscopy (such as a colonoscopy or flexible sigmoidoscopy) you may notice spotting of blood in your stool or on the toilet paper. If you underwent a bowel prep for your procedure, you may not have a normal bowel movement for a few days.  Please Note:  You might notice some irritation and congestion in your nose or some drainage.  This is from the oxygen used during your procedure.  There is no need for concern and it should clear up in a day or so.  SYMPTOMS TO REPORT IMMEDIATELY:  Following lower endoscopy (colonoscopy or flexible sigmoidoscopy):  Excessive amounts of blood in the stool  Significant tenderness or worsening of abdominal pains  Swelling of the abdomen that is new, acute  Fever of 100F or higher  For urgent or emergent issues, a gastroenterologist can be reached at any hour by calling [phone]. Do not use MyChart messaging for urgent concerns.    DIET:  We do recommend a small meal at first, but then you may proceed to  your regular diet.  Drink plenty of fluids but you should avoid alcoholic beverages for 24 hours.  ACTIVITY:  You should plan to take it easy for the rest of today and you should NOT DRIVE or use heavy machinery until tomorrow (because of the sedation medicines used during the test).    FOLLOW UP: Our staff will call the number listed on your records the next business day following your procedure.  We will call around 7:15- 8:00 am to check on you and address any questions or concerns that you may have regarding the information given to you following your procedure. If we do not reach you, we will leave a message.     If any biopsies were taken you will be contacted by phone or by letter within the next 1-3 weeks.  Please call us at [phone] if you have not heard about the biopsies in 3 weeks.    SIGNATURES/CONFIDENTIALITY: You and/or your care partner have signed paperwork which will be entered into your electronic medical record.  These signatures attest to the fact that that the information above on your After Visit Summary has been reviewed and is understood.  Full responsibility of the confidentiality of this discharge information lies with you and/or your care-partner.

## 2023-02-04 ENCOUNTER — Telehealth: Payer: Self-pay

## 2023-02-04 NOTE — Telephone Encounter (Signed)
  Follow up Call-     02/03/2023    2:01 PM  Call back number  Post procedure Call Back phone  # [phone]  Permission to leave phone message Yes     Patient questions:  Do you have a fever, pain , or abdominal swelling? No. Pain Score  0 *  Have you tolerated food without any problems? Yes.    Have you been able to return to your normal activities? Yes.    Do you have any questions about your discharge instructions: Diet   No. Medications  No. Follow up visit  No.  Do you have questions or concerns about your Care? No.  Actions: * If pain score is 4 or above: No action needed, pain <4.

## 2023-02-09 LAB — SURGICAL PATHOLOGY

## 2023-02-11 ENCOUNTER — Encounter: Payer: Self-pay | Admitting: Gastroenterology

## 2023-02-15 NOTE — Telephone Encounter (Signed)
Called and spoke with patient to follow up and see how he was feeling. Pt states that over the weekend he had residual abdominal pain similar to previous diverticulitis episode. Pt states that he did a liquid diet for about 1.5 days then slowly introduced light foods. Pt states that as of right now he is doing better and will let us know if anything changes otherwise. Pt verbalized understanding and had no concerns at the end of the call.

## 2024-04-06 ENCOUNTER — Emergency Department (HOSPITAL_COMMUNITY)
Admission: EM | Admit: 2024-04-06 | Discharge: 2024-04-06 | Disposition: A | Discharge status: Home / Self Care | Attending: Emergency Medicine | Admitting: Emergency Medicine

## 2024-04-06 DIAGNOSIS — R1024 Suprapubic pain: Secondary | ICD-10-CM | POA: Diagnosis present

## 2024-04-06 LAB — COMPREHENSIVE METABOLIC PANEL WITH GFR
ALT: 22 U/L (ref 0–44)
AST: 23 U/L (ref 15–41)
Albumin: 4.6 g/dL (ref 3.5–5.0)
Alkaline Phosphatase: 106 U/L (ref 38–126)
Anion gap: 10 (ref 5–15)
BUN: 15 mg/dL (ref 6–20)
CO2: 28 mmol/L (ref 22–32)
Calcium: 9.5 mg/dL (ref 8.9–10.3)
Chloride: 102 mmol/L (ref 98–111)
Creatinine, Ser: 0.97 mg/dL (ref 0.61–1.24)
GFR, Estimated: >60 mL/min (ref >60)
Glucose, Bld: 114 mg/dL — ABNORMAL HIGH (ref 70–99)
Potassium: 3.6 mmol/L (ref 3.5–5.1)
Sodium: 140 mmol/L (ref 135–145)
Total Bilirubin: 0.6 mg/dL (ref 0.0–1.2)
Total Protein: 7.2 g/dL (ref 6.5–8.1)

## 2024-04-06 LAB — CBC
HCT: 40.7 % (ref 39.0–52.0)
Hemoglobin: 14.3 g/dL (ref 13.0–17.0)
MCH: 30 pg (ref 26.0–34.0)
MCHC: 35.1 g/dL (ref 30.0–36.0)
MCV: 85.3 fL (ref 80.0–100.0)
Platelets: 192 K/uL (ref 150–400)
RBC: 4.77 MIL/uL (ref 4.22–5.81)
RDW: 12 % (ref 11.5–15.5)
WBC: 9.7 K/uL (ref 4.0–10.5)
nRBC: 0 % (ref 0.0–0.2)

## 2024-04-06 LAB — URINALYSIS, ROUTINE W REFLEX MICROSCOPIC
Bilirubin Urine: NEGATIVE
Glucose, UA: NEGATIVE mg/dL
Hgb urine dipstick: NEGATIVE
Ketones, ur: NEGATIVE mg/dL
Leukocytes,Ua: NEGATIVE
Nitrite: NEGATIVE
Protein, ur: NEGATIVE mg/dL
Specific Gravity, Urine: 1.021 (ref 1.005–1.030)
pH: 6 (ref 5.0–8.0)

## 2024-04-06 LAB — TYPE AND SCREEN
ABO/RH(D): O NEG
Antibody Screen: NEGATIVE

## 2024-04-06 LAB — LIPASE, BLOOD: Lipase: 41 U/L (ref 11–51)

## 2024-04-06 MED ORDER — SENNOSIDES-DOCUSATE SODIUM 8.6-50 MG PO TABS: 2.0000 | ORAL_TABLET | Freq: Every day | ORAL | 0 refills | Status: AC

## 2024-04-06 MED ORDER — AMOXICILLIN-POT CLAVULANATE 875-125 MG PO TABS: 1.0000 | ORAL_TABLET | Freq: Two times a day (BID) | ORAL | 0 refills | Status: AC

## 2024-04-06 MED ORDER — AMOXICILLIN-POT CLAVULANATE 875-125 MG PO TABS
1.0000 | ORAL_TABLET | Freq: Once | ORAL | Status: AC
  Administered 2024-04-06: 1 via ORAL
  Filled 2024-04-06: qty 1

## 2024-04-06 NOTE — ED Notes (Signed)
 Writer reminded pt of providing urine sample.

## 2024-04-06 NOTE — Discharge Instructions (Addendum)
 Be sure to follow-up with the gastroenterologist.  Return here for concerning changes in your condition.

## 2024-04-06 NOTE — ED Triage Notes (Signed)
 Pt arrives via POV. Pt c/o abdominal pain and nausea that started this morning. Pt reports he noticed some blood in his stool around 1400.

## 2024-04-06 NOTE — ED Provider Notes (Signed)
 [place] [facility] AT [facility] Provider Note   CSN: [id number] Arrival date & time: 04/06/24  1416     Patient presents with: Abdominal Pain and Rectal Bleeding   Paul Rodgers is a 38 y.o. male.   HPI Patient presents with abdominal pain.  He notes that last bowel movement was yesterday, and today, with worsening abdominal pain he felt the urge to defecate.  He was able to do so, but noticed some blood in the stool. He has 1 sick family member with a GI bug, and he himself has a history of diverticulitis, including multiple episodes of the past few years.  Colonoscopy performed September of last year for follow-up was notable for internal hemorrhoids, several sessile polyps.     Prior to Admission medications   Medication Sig Start Date End Date Taking? Authorizing Provider  amoxicillin -clavulanate (AUGMENTIN ) 875-125 MG tablet Take 1 tablet by mouth every 12 (twelve) hours. 04/06/24  Yes Garrick Charleston, MD  senna-docusate (SENOKOT-S) 8.6-50 MG tablet Take 2 tablets by mouth daily for 7 days. 04/06/24 04/13/24 Yes Garrick Charleston, MD  FIBER ADULT GUMMIES PO Take by mouth. 2 gummies daily    [provider]  HYDROcodone -acetaminophen  (NORCO) 5-325 MG tablet Take 1 tablet by mouth every 4 (four) hours as needed for severe pain. 12/14/22   Freddi Hamilton, MD  metroNIDAZOLE  (FLAGYL ) 500 MG tablet Take 1 tablet (500 mg total) by mouth 2 (two) times daily. 12/14/22   Freddi Hamilton, MD  Multiple Vitamins-Minerals (ZINC PO) Take 1 tablet by mouth daily.    [provider]  OVER THE COUNTER MEDICATION OTC allergy medicine - one daily    [provider]    Allergies: Patient has no known allergies.    Review of Systems  Updated Vital Signs BP (!) 131/91 (BP Location: Right Arm)   Pulse 94   Temp 99.4 F (37.4 C) (Oral)   Resp 17   SpO2 100%   Physical Exam Vitals and nursing note reviewed.  Constitutional:      General:  He is not in acute distress.    Appearance: He is well-developed.  HENT:     Head: Normocephalic and atraumatic.  Eyes:     Conjunctiva/sclera: Conjunctivae normal.  Cardiovascular:     Rate and Rhythm: Normal rate and regular rhythm.  Pulmonary:     Effort: Pulmonary effort is normal. No respiratory distress.     Breath sounds: No stridor.  Abdominal:     General: There is no distension.     Tenderness: There is no abdominal tenderness. There is no guarding or rebound.  Skin:    General: Skin is warm and dry.  Neurological:     Mental Status: He is alert and oriented to person, place, and time.     (all labs ordered are listed, but only abnormal results are displayed) Labs Reviewed  COMPREHENSIVE METABOLIC PANEL WITH GFR - Abnormal; Notable for the following components:      Result Value   Glucose, Bld 114 (*)    All other components within normal limits  LIPASE, BLOOD  CBC  URINALYSIS, ROUTINE W REFLEX MICROSCOPIC  POC OCCULT BLOOD, ED  TYPE AND SCREEN  ABO/RH    EKG: None  Radiology: No results found.   Procedures   Medications Ordered in the ED  amoxicillin -clavulanate (AUGMENTIN ) 875-125 MG per tablet 1 tablet (has no administration in time range)  Medical Decision Making Patient with history of diverticulitis, now currently without any symptoms, including resolution of his pain presents after an episode of pain that began earlier in the day, and had 1 episode of defecation with bleeding. Concern for broad differential including diverticulitis, internal hemorrhoids, less likely bacteremia, sepsis, perforation given the absence of pain, unremarkable hemodynamics. At bedside I discussed the patient's lab values with him, we reviewed the colonoscopy pictures and results from last year, discussing possibilities for today's presentation including diverticulitis, GI bug, hemorrhoids.  Absent any abdominal pain, low suspicion for  peritonitis, patient will not have CT scan, which which he is amenable, will follow-up with his gastroenterologist will start empiric antibiotic therapy.   Amount and/or Complexity of Data Reviewed External Data Reviewed: notes. Labs:  Decision-making details documented in ED Course. ECG/medicine tests:  Decision-making details documented in ED Course.  Risk OTC drugs. Prescription drug management.     Final diagnoses:  Suprapubic pain    ED Discharge Orders          Ordered    amoxicillin -clavulanate (AUGMENTIN ) 875-125 MG tablet  Every 12 hours        04/06/24 2049    senna-docusate (SENOKOT-S) 8.6-50 MG tablet  Daily        04/06/24 2049               Garrick Charleston, MD 04/06/24 2053
